# Patient Record
Sex: Male | Born: 1980 | Race: White | Hispanic: No | Marital: Single | Smoking: Former smoker
Health system: Southern US, Community
[De-identification: ages and names within clinical notes are randomized; demographics above are authoritative.]

---
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
## 2014-12-29 ENCOUNTER — Encounter: Payer: Self-pay | Admitting: Family Medicine

## 2014-12-29 ENCOUNTER — Ambulatory Visit (INDEPENDENT_AMBULATORY_CARE_PROVIDER_SITE_OTHER): Payer: Managed Care, Other (non HMO) | Admitting: Family Medicine

## 2014-12-29 VITALS — BP 121/71 | HR 65 | Ht 72.0 in | Wt 196.0 lb

## 2014-12-29 DIAGNOSIS — N508 Other specified disorders of male genital organs: Secondary | ICD-10-CM | POA: Diagnosis not present

## 2014-12-29 DIAGNOSIS — M7122 Synovial cyst of popliteal space [Baker], left knee: Secondary | ICD-10-CM | POA: Diagnosis not present

## 2014-12-29 DIAGNOSIS — N5082 Scrotal pain: Secondary | ICD-10-CM

## 2014-12-29 MED ORDER — MELOXICAM 15 MG PO TABS: 15.0000 mg | ORAL_TABLET | Freq: Every day | ORAL | Status: DC

## 2014-12-29 NOTE — Progress Notes (Signed)
CC: Christopher [name] is a 34 y.o. male is here for Establish Care   Subjective: HPI:  Pleasant 34 year old here to establish care  Complains of left knee pain that has been present for the past week. Localizing the medial surface of the knee. It is not radiating. It slightly improved with wearing a compression sleeve on the knee. It's also slightly improved with ibuprofen. He's noticed a bulge on the back of the knee that was noticed 1 week ago as well. He denies any recent or remote trauma. It seems to feel stiff and after periods of inactivity. He denies any mechanical symptoms or any other signs of swelling or redness on the knee.  He also complains of left testicle pain that has been present for the past 1-2 weeks. It's only present when taking hot showers. It's absent as soon as she gets out of the shower. Nothing else makes symptoms better or worse. It is mild in severity. He's never had this before. He denies any skin pain on the scrotum, penile discharge, dysuria or any other genitourinary complaints. Denies any recent or remote trauma to the testicles.   Review Of Systems Outlined In HPI  History reviewed. No pertinent past medical history.  Past Surgical History  Procedure Laterality Date  . Appendectomy  1989   Family History  Problem Relation Age of Onset  . Bone cancer      grandmother  . Diabetes      grandfather  . Stroke      grandfathers    Social History   Social History  . Marital Status: Single    Spouse Name: N/A  . Number of Children: N/A  . Years of Education: N/A   Occupational History  . Not on file.   Social History Main Topics  . Smoking status: Current Every Day Smoker  . Smokeless tobacco: Former Neurosurgeon    Quit date: 09/20/2010  . Alcohol Use: No  . Drug Use: Yes  . Sexual Activity:    Partners: Female   Other Topics Concern  . Not on file   Social History Narrative     Objective: BP 121/71 mmHg  Pulse 65  Ht 6' (1.829 m)  Wt 196  lb (88.905 kg)  BMI 26.58 kg/m2  General: Alert and Oriented, No Acute Distress HEENT: Pupils equal, round, reactive to light. Conjunctivae clear.  Moist mucous membranes Lungs: Clear to auscultation bilaterally, no wheezing/ronchi/rales.  Comfortable work of breathing. Good air movement. Cardiac: Regular rate and rhythm. Normal S1/S2.  No murmurs, rubs, nor gallops.   Genitourinary:patient declined Extremities: No peripheral edema.  Strong peripheral pulses. Left knee exam shows full-strength and range of motion. There is no swelling, redness, nor warmth overlying the knee.  No patellar crepitus. No patellar apprehension. No pain with palpation of the inferior patellar pole.  No pain or laxity with valgus nor varus stress. Anterior drawer is negative. Mild medial joint line tenderness to palpation. There is a approximate 1.5 cm soft fixed mass on the popliteal space which is non-pulsatile. Mental Status: No depression, anxiety, nor agitation. Skin: Warm and dry.  Assessment & Plan: Christopher [name] was seen today for establish care.  Diagnoses and all orders for this visit:  Baker cyst, left -     meloxicam (MOBIC) 15 MG tablet; Take 1 tablet (15 mg total) by mouth daily.  Scrotal pain -     meloxicam (MOBIC) 15 MG tablet; Take 1 tablet (15 mg total) by mouth daily.   Scrotal  pain: Most likely varicocele based only on history given inability to do exam today. Discussed wearing well fitting snug underwear to help with support of the scrotum in addition to starting meloxicam daily. If no better after 2 weeks return for examination or call for scheduling a scrotal ultrasound. Left knee pain and Baker's cyst: Start meloxicam and home rehabilitation exercises focusing on MCL rehabilitation. If no better in 2 weeks call and I will arrange an appointment with our sports medicine doctors for second opinion.  Return for Call if no better in 2 weeks, ultrasounds would be the next step.Marland Kitchen

## 2015-03-17 ENCOUNTER — Ambulatory Visit (INDEPENDENT_AMBULATORY_CARE_PROVIDER_SITE_OTHER): Payer: Managed Care, Other (non HMO) | Admitting: Family Medicine

## 2015-03-17 ENCOUNTER — Encounter: Payer: Self-pay | Admitting: Family Medicine

## 2015-03-17 VITALS — BP 114/77 | HR 62 | Wt 198.0 lb

## 2015-03-17 DIAGNOSIS — M545 Low back pain, unspecified: Secondary | ICD-10-CM

## 2015-03-17 DIAGNOSIS — H6091 Unspecified otitis externa, right ear: Secondary | ICD-10-CM | POA: Diagnosis not present

## 2015-03-17 MED ORDER — NEOMYCIN-POLYMYXIN-HC 1 % OT SOLN: OTIC | Status: AC

## 2015-03-17 MED ORDER — PREDNISONE 20 MG PO TABS: ORAL_TABLET | ORAL | Status: DC
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
## 2015-03-29 ENCOUNTER — Encounter: Payer: Self-pay | Admitting: Family Medicine

## 2015-03-29 ENCOUNTER — Encounter: Payer: Managed Care, Other (non HMO) | Admitting: Family Medicine

## 2015-03-29 ENCOUNTER — Ambulatory Visit (INDEPENDENT_AMBULATORY_CARE_PROVIDER_SITE_OTHER): Payer: Managed Care, Other (non HMO)

## 2015-03-29 ENCOUNTER — Ambulatory Visit (INDEPENDENT_AMBULATORY_CARE_PROVIDER_SITE_OTHER): Payer: Managed Care, Other (non HMO) | Admitting: Family Medicine

## 2015-03-29 ENCOUNTER — Ambulatory Visit (INDEPENDENT_AMBULATORY_CARE_PROVIDER_SITE_OTHER): Payer: Managed Care, Other (non HMO) | Admitting: Sports Medicine

## 2015-03-29 VITALS — BP 119/74 | HR 80 | Wt 205.0 lb

## 2015-03-29 DIAGNOSIS — R51 Headache: Secondary | ICD-10-CM

## 2015-03-29 DIAGNOSIS — R519 Headache, unspecified: Secondary | ICD-10-CM

## 2015-03-29 DIAGNOSIS — H538 Other visual disturbances: Secondary | ICD-10-CM | POA: Diagnosis not present

## 2015-03-29 DIAGNOSIS — M5416 Radiculopathy, lumbar region: Secondary | ICD-10-CM

## 2015-03-29 DIAGNOSIS — M51369 Other intervertebral disc degeneration, lumbar region without mention of lumbar back pain or lower extremity pain: Secondary | ICD-10-CM | POA: Insufficient documentation

## 2015-03-29 DIAGNOSIS — M545 Low back pain: Secondary | ICD-10-CM

## 2015-03-29 DIAGNOSIS — M5136 Other intervertebral disc degeneration, lumbar region: Secondary | ICD-10-CM | POA: Insufficient documentation
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
## 2015-03-29 NOTE — Progress Notes (Signed)
CC: Christopher [name] is a 34 y.o. male is here for Dizziness; Headache; and Blurred Vision   Subjective: HPI:  On Saturday this past weekend he had an episode of headache, 3 seconds blurred vision, and nausea which lasted the entire day. Headache was localized behind the eyes and pounding. Worse with loud noises and light. Symptoms came on after he drink multiple cups of coffee along with multiple candy bars and numerous bags of skittles. He's never had these symptoms before. He is in his regular state of health today. He denies racing heartbeat, irregular heartbeat, chest pain. He denies any confusion or disorientation. He denies any motor or sensory disturbances other than that described above.   Review Of Systems Outlined In HPI  No past medical history on file.  Past Surgical History  Procedure Laterality Date  . Appendectomy  1989   Family History  Problem Relation Age of Onset  . Bone cancer      grandmother  . Diabetes      grandfather  . Stroke      grandfathers    Social History   Social History  . Marital Status: Single    Spouse Name: N/A  . Number of Children: N/A  . Years of Education: N/A   Occupational History  . Not on file.   Social History Main Topics  . Smoking status: Current Every Day Smoker  . Smokeless tobacco: Former NeurosurgeonUser    Quit date: 09/20/2010  . Alcohol Use: No  . Drug Use: Yes  . Sexual Activity:    Partners: Female   Other Topics Concern  . Not on file   Social History Narrative     Objective: BP 119/74 mmHg  Pulse 80  Wt 205 lb (92.987 kg)  General: Alert and Oriented, No Acute Distress HEENT: Pupils equal, round, reactive to light. Conjunctivae clear.  Moist mucous membranes pharynx unremarkable Cranial nerve II through XII grossly intact Lungs: Clear to auscultation bilaterally, no wheezing/ronchi/rales.  Comfortable work of breathing. Good air movement. Cardiac: Regular rate and rhythm. Normal S1/S2.  No murmurs, rubs,  nor gallops.   Extremities: No peripheral edema.  Strong peripheral pulses.  Mental Status: No depression, anxiety, nor agitation. Skin: Warm and dry.  Assessment & Plan: [name] was seen today for dizziness, headache and blurred vision.  Diagnoses and all orders for this visit:  Blurred vision -     CBC -     COMPLETE METABOLIC PANEL WITH GFR  Acute nonintractable headache, unspecified headache type -     CBC -     COMPLETE METABOLIC PANEL WITH GFR   Transient blurred vision along with headache. Suspicion is that he had a small migraine triggered by all the sugar he was eating. Will rule out more serious conditions with CBC and metabolic panel above. Signs and symptoms requring emergent/urgent reevaluation were discussed with the patient.   Return if symptoms worsen or fail to improve.

## 2015-03-29 NOTE — Progress Notes (Signed)
   Subjective:    I'm seeing this patient as a consultation for:  Dr. Ivan AnchorsHommel  CC: low back pain  HPI: This is a pleasant 34 -year-old male, for the past several years he's had increasing pain that he localizes in the midline of the low back, worse with sitting, flexion, Valsalva, with radiation down the left leg, but not to the foot. No bowel or bladder dysfunction, saddle numbness.He finished a course of prednisone that provided some relief, he has also been doing meloxicam which has also provided greater relief. Unfortunately symptoms are persistent, has not yet done physical therapy or had advanced imaging.  Past medical history, Surgical history, Family history not pertinant except as noted below, Social history, Allergies, and medications have been entered into the medical record, reviewed, and no changes needed.   Review of Systems: No headache, visual changes, nausea, vomiting, diarrhea, constipation, dizziness, abdominal pain, skin rash, fevers, chills, night sweats, weight loss, swollen lymph nodes, body aches, joint swelling, muscle aches, chest pain, shortness of breath, mood changes, visual or auditory hallucinations.   Objective:   General: Well Developed, well nourished, and in no acute distress.  Neuro/Psych: Alert and oriented x3, extra-ocular muscles intact, able to move all 4 extremities, sensation grossly intact. Skin: Warm and dry, no rashes noted.  Respiratory: Not using accessory muscles, speaking in full sentences, trachea midline.  Cardiovascular: Pulses palpable, no extremity edema. Abdomen: Does not appear distended. Back Exam:  Inspection: Unremarkable  Motion: Flexion 45 deg, Extension 45 deg, Side Bending to 45 deg bilaterally,  Rotation to 45 deg bilaterally  SLR laying: Negative  XSLR laying: Negative  Palpable tenderness: None. FABER: negative. Sensory change: Gross sensation intact to all lumbar and sacral dermatomes.  Reflexes: 2+ at both patellar  tendons, 2+ at achilles tendons, Babinski's downgoing.  Strength at foot  Plantar-flexion: 5/5 Dorsi-flexion: 5/5 Eversion: 5/5 Inversion: 5/5  Leg strength  Quad: 5/5 Hamstring: 5/5 Hip flexor: 5/5 Hip abductors: 5/5  Gait unremarkable.  Impression and Recommendations:   This case required medical decision making of moderate complexity.

## 2015-03-30 LAB — COMPLETE METABOLIC PANEL WITH GFR
ALK PHOS: 40 U/L (ref 40–115)
ALT: 20 U/L (ref 9–46)
AST: 20 U/L (ref 10–40)
Albumin: 4.1 g/dL (ref 3.6–5.1)
BILIRUBIN TOTAL: 0.4 mg/dL (ref 0.2–1.2)
BUN: 17 mg/dL (ref 7–25)
CALCIUM: 9.2 mg/dL (ref 8.6–10.3)
CO2: 31 mmol/L (ref 20–31)
CREATININE: 1.07 mg/dL (ref 0.60–1.35)
Chloride: 100 mmol/L (ref 98–110)
GFR, Est Non African American: 89 mL/min (ref >=60)
Glucose, Bld: 87 mg/dL (ref 65–99)
Potassium: 4.1 mmol/L (ref 3.5–5.3)
Sodium: 143 mmol/L (ref 135–146)
TOTAL PROTEIN: 6.5 g/dL (ref 6.1–8.1)

## 2015-03-30 LAB — CBC
HEMATOCRIT: 45.5 % (ref 39.0–52.0)
Hemoglobin: 15.6 g/dL (ref 13.0–17.0)
MCH: 30.5 pg (ref 26.0–34.0)
MCHC: 34.3 g/dL (ref 30.0–36.0)
MCV: 89 fL (ref 78.0–100.0)
MPV: 9.5 fL (ref 8.6–12.4)
Platelets: 182 10*3/uL (ref 150–400)
RBC: 5.11 MIL/uL (ref 4.22–5.81)
RDW: 13.6 % (ref 11.5–15.5)
WBC: 8.1 10*3/uL (ref 4.0–10.5)

## 2015-03-31 ENCOUNTER — Other Ambulatory Visit: Payer: Self-pay | Admitting: *Deleted

## 2015-03-31 ENCOUNTER — Telehealth: Payer: Self-pay | Admitting: *Deleted

## 2015-03-31 MED ORDER — MELOXICAM 15 MG PO TABS: 15.0000 mg | ORAL_TABLET | Freq: Every day | ORAL | Status: DC

## 2015-03-31 NOTE — Telephone Encounter (Signed)
Spoke with patient about lab and xray results.  He mentioned getting a refill on the meloxicam to help with his back pain Sent to his pharmacy

## 2015-04-14 ENCOUNTER — Ambulatory Visit: Payer: Managed Care, Other (non HMO) | Admitting: Rehabilitative and Restorative Service Providers"

## 2015-04-21 ENCOUNTER — Encounter: Payer: Self-pay | Admitting: Sports Medicine

## 2015-05-03 ENCOUNTER — Ambulatory Visit: Payer: Managed Care, Other (non HMO) | Admitting: Sports Medicine

## 2015-05-05 ENCOUNTER — Encounter: Payer: Self-pay | Admitting: Physical Therapy

## 2015-05-05 ENCOUNTER — Ambulatory Visit (INDEPENDENT_AMBULATORY_CARE_PROVIDER_SITE_OTHER): Payer: Managed Care, Other (non HMO) | Admitting: Physical Therapy

## 2015-05-05 DIAGNOSIS — M6281 Muscle weakness (generalized): Secondary | ICD-10-CM | POA: Diagnosis not present

## 2015-05-05 DIAGNOSIS — M5442 Lumbago with sciatica, left side: Secondary | ICD-10-CM | POA: Diagnosis not present

## 2015-05-05 DIAGNOSIS — M2560 Stiffness of unspecified joint, not elsewhere classified: Secondary | ICD-10-CM

## 2015-05-05 DIAGNOSIS — R29898 Other symptoms and signs involving the musculoskeletal system: Secondary | ICD-10-CM

## 2015-05-05 DIAGNOSIS — M2569 Stiffness of other specified joint, not elsewhere classified: Secondary | ICD-10-CM
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
## 2015-05-05 NOTE — Patient Instructions (Signed)
Pelvic Press   [place] [phone]       Place hands under belly between navel and pubic bone, palms up. Feel pressure on hands. Increase pressure on hands by pressing pelvis down. This is NOT a pelvic tilt. Hold _5__ seconds. Relax. Repeat _5__ times.  Leg Lift: One-Leg    Press pelvis down. Keep knee straight; lengthen and lift one leg (from waist). Do not twist body. Keep other leg down. Hold _1__ seconds. Relax. Repeat 10 time. Repeat with other leg. Build up 2 sets of 10.   Outer Hip Stretch: Reclined IT Band Stretch (Strap)    Strap around opposite foot, pull across only as far as possible with shoulders on mat. Hold for _10___ breaths. Repeat __2__ times each leg.  Copyright  VHI. All rights reserved.

## 2015-05-07 ENCOUNTER — Ambulatory Visit (INDEPENDENT_AMBULATORY_CARE_PROVIDER_SITE_OTHER): Payer: Managed Care, Other (non HMO) | Admitting: Physical Therapy

## 2015-05-07 DIAGNOSIS — M2560 Stiffness of unspecified joint, not elsewhere classified: Secondary | ICD-10-CM | POA: Diagnosis not present

## 2015-05-07 DIAGNOSIS — M6281 Muscle weakness (generalized): Secondary | ICD-10-CM | POA: Diagnosis not present

## 2015-05-07 DIAGNOSIS — R29898 Other symptoms and signs involving the musculoskeletal system: Secondary | ICD-10-CM

## 2015-05-07 DIAGNOSIS — M2569 Stiffness of other specified joint, not elsewhere classified: Secondary | ICD-10-CM

## 2015-05-07 DIAGNOSIS — M5442 Lumbago with sciatica, left side: Secondary | ICD-10-CM | POA: Diagnosis not present

## 2015-05-07 NOTE — Patient Instructions (Signed)
  KNEE: Flexion - Prone   Hold pelvic press. Bend knee, then return the foot down. Repeat on opposite leg. Do not raise hips. _10__ reps per set. When this is mastered, pull both heels up at same time, x 10 reps.  Once a day  HIP: Extension / KNEE: Flexion - Prone    Hold pelvic press. Bend knee, squeeze glutes. Raise leg up  10___ reps per set, _1__ sets per day, _1__ time a day.   Abdominal Bracing With Pelvic Floor (Hook-Lying)    With neutral spine, tighten pelvic floor and abdominals, hold 5-10 sec. Repeat _10__ times. Do _1__ times a day.  Scapular Retraction: Abduction (Prone)    Lie with upper arms straight out from sides, elbows bent to 90. Pinch shoulder blades together and raise arms a few inches from floor. Slightly lift forehead off towel.  Repeat __10__ times per set. Do _1-2___ sets per session. Do _1___ sessions per day.  [facility] [address] [place], [place] [address]  [phone] (office) [phone] (fax)

## 2015-05-07 NOTE — Therapy (Signed)
[facility] [place] [address] [place], [place], [address] Phone: [phone]   Fax:  [phone]  Physical Therapy Treatment  Patient Details  Name: Christopher [name] MRN: [id number] Date of Birth: Sep 07, 1980 Referring Provider: Dr. Briant Sites  Encounter Date: 05/07/2015      PT End of Session - 05/07/15 1107    Visit Number 2   Number of Visits 8   Date for PT Re-Evaluation 06/02/15   PT Start Time 1105   PT Stop Time 1150   PT Time Calculation (min) 45 min      No past medical history on file.  Past Surgical History  Procedure Laterality Date  . Appendectomy  1989    There were no vitals filed for this visit.  Visit Diagnosis:  Left-sided low back pain with left-sided sciatica  Weakness of back  Back stiffness      Subjective Assessment - 05/07/15 1108    Subjective Pt reports he feels a little better since last visit. He believes the exercises are helping.  Pt reports he only has pain with bending, lifting, and long drives (to [place]).     Currently in Pain? No/denies            [facility] PT Assessment - 05/07/15 0001    Assessment   Medical Diagnosis Lt lumbar radiculopathy    Referring Provider Dr. Briant Sites   Onset Date/Surgical Date 05/04/10   Hand Dominance Right   Next MD Visit not scheduled yet         [facility] Adult PT Treatment/Exercise - 05/07/15 0001    Bed Mobility   Bed Mobility --  educated on log roll supine to/from sit. Pt returned demo 2x with VC.    Lumbar Exercises: Stretches   Passive Hamstring Stretch 30 seconds;2 reps  2 sets   ITB Stretch 1 rep;60 seconds  with strap, supine   Piriformis Stretch 2 reps;30 seconds   Lumbar Exercises: Aerobic   Stationary Bike NuStep L4: 6 min    Lumbar Exercises: Standing   Other Standing Lumbar Exercises Doorway stretch mid height x 30 sec x 2, high height x 30 sec x 2 reps, shoulder ext stretch (hands clasped behind back) 30 sec  x 2 reps    Lumbar Exercises: Supine   Ab Set 10 reps;5 seconds   Lumbar Exercises: Prone   Other Prone Lumbar Exercises 10 reps pelvic press 5 sec hold, 10 reps press with hip ext.( knee straight), 10 reps each leg hip ext, knee bent.   noted Lt multifidi firing before Rt   Other Prone Lumbar Exercises scap retraction with goal post position arms x 10 reps (tactile cues and demo for improved form)          PT Education - 05/07/15 1149    Education provided Yes   Education Details HEP    Person(s) Educated Patient   Methods Handout;Explanation;Demonstration   Comprehension Verbalized understanding;Returned demonstration            PT Long Term Goals - 05/05/15 1031    PT LONG TERM GOAL #1   Title I with advance HEP ( 06/02/15)    Time 4   Period Weeks   Status New   PT LONG TERM GOAL #2   Title perform forward flexion without pain ( 06/02/15)    Time 4   Period Weeks   Status New   PT LONG TERM GOAL #3   Title demo strong and = contraction of the  lumbar multifidi ( 06/02/15)    Time 4   Period Weeks   Status New   PT LONG TERM GOAL #4   Title verbalize understanding of safe body mechanics while performing yard work ( 06/02/15)    Time 4   Period Weeks   Status New   PT LONG TERM GOAL #5   Title improve FOTO =/< 27% limited ( 06/02/15)    Time 4   Period Weeks   Status New               Plan - 05/07/15 1127    Clinical Impression Statement Pt tolerated all exercises well, reporting decreased stiffness with new stretches.  Progressing towards goals.    Pt will benefit from skilled therapeutic intervention in order to improve on the following deficits Increased muscle spasms;Pain;Hypomobility;Decreased strength   Rehab Potential Good   PT Frequency 2x / week   PT Duration 4 weeks   PT Treatment/Interventions Ultrasound;Traction;Neuromuscular re-education;Patient/family education;Dry needling;Cryotherapy;Electrical Stimulation;Moist Heat;Therapeutic  exercise;Manual techniques   PT Next Visit Plan progress pelvic press series and stretches, manual work to lumbar mobs vs STW   Consulted and Agree with Plan of Care Patient        Problem List Patient Active Problem List   Diagnosis Date Noted  . Left lumbar radiculopathy 03/29/2015    Mayer Camel, PTA 05/07/2015 12:17 PM  [facility] [place] [address] [place], [place], [address] Phone: [phone]   Fax:  [phone]  Name: Christopher [name] MRN: [id number] Date of Birth: 1980/11/08

## 2015-05-11 ENCOUNTER — Ambulatory Visit (INDEPENDENT_AMBULATORY_CARE_PROVIDER_SITE_OTHER): Payer: Managed Care, Other (non HMO) | Admitting: Physical Therapy

## 2015-05-11 DIAGNOSIS — M2569 Stiffness of other specified joint, not elsewhere classified: Secondary | ICD-10-CM

## 2015-05-11 DIAGNOSIS — M2560 Stiffness of unspecified joint, not elsewhere classified: Secondary | ICD-10-CM

## 2015-05-11 DIAGNOSIS — M5442 Lumbago with sciatica, left side: Secondary | ICD-10-CM | POA: Diagnosis not present

## 2015-05-11 DIAGNOSIS — M6281 Muscle weakness (generalized): Secondary | ICD-10-CM

## 2015-05-11 DIAGNOSIS — R29898 Other symptoms and signs involving the musculoskeletal system: Secondary | ICD-10-CM

## 2015-05-11 NOTE — Patient Instructions (Signed)
  Abdominal Bracing With Pelvic Floor (Hook-Lying)   With neutral spine, tighten pelvic floor and abdominals. Hold 10 seconds. Repeat __10_ times. Do _1__ times a day.   Knee to Chest: Transverse Plane Stability   Bring one knee up, then return. Be sure pelvis does not roll side to side. Keep pelvis still. Lift knee __10_ times each leg. Restabilize pelvis. Repeat with other leg. Do _1-2__ sets, _1__ times per day.   Hip External Rotation With Pillow: Transverse Plane Stability   One knee bent, one leg straight, on pillow. Slowly roll bent knee out. Be sure pelvis does not rotate. Do _10__ times. Restabilize pelvis. Repeat with other leg. Do _1-2__ sets, _1__ times per day.  Heel Slide: 4-10 Inches - Transverse Plane Stability   Slide heel 4 inches down. Be sure pelvis does not rotate. Do _10__ times. Restabilize pelvis. Repeat with other leg. Do __1_ sets, _1__ times per day.   [facility] [address] [place], [place] [address]  [phone] (office) [phone] (fax)   

## 2015-05-11 NOTE — Therapy (Addendum)
[facility], [place], [address] Phone: [phone]   Fax:  [phone]  Physical Therapy Treatment  Patient Details  Name: Christopher [name] MRN: [id number] Date of Birth: January 02, 1981 Referring Provider: Dr. Helane Rima  Encounter Date: 05/11/2015      PT End of Session - 05/11/15 1104    Visit Number 3   Number of Visits 8   Date for PT Re-Evaluation 06/02/15   PT Start Time 1104   PT Stop Time 1144   PT Time Calculation (min) 40 min      No past medical history on file.  Past Surgical History  Procedure Laterality Date  . Appendectomy  1989    There were no vitals filed for this visit.  Visit Diagnosis:  Left-sided low back pain with left-sided sciatica  Weakness of back  Back stiffness      Subjective Assessment - 05/11/15 1105    Subjective Pt reports things are improving with his low back. He has noticed reduced pain with bending over.   Performing HEP right before bed after work.    Currently in Pain? Yes   Pain Score 1    Pain Location Back   Pain Orientation Left   Pain Descriptors / Indicators Tightness;Dull   Pain Radiating Towards down to posterior Lt knee   Aggravating Factors  prolonged sitting   Pain Relieving Factors standing, heat             [facility] PT Assessment - 05/11/15 0001    Assessment   Medical Diagnosis Lt lumbar radiculopathy    Referring Provider Dr. Helane Rima   Onset Date/Surgical Date 05/04/10   Hand Dominance Right   Next MD Visit not scheduled yet           [facility] Adult PT Treatment/Exercise - 05/11/15 0001    Lumbar Exercises: Stretches   Passive Hamstring Stretch 30 seconds;2 reps  2 sets   ITB Stretch 2 reps;30 seconds  supine with strap   ITB Stretch Limitations tactile cues for improved form    Piriformis Stretch 3 reps;30 seconds  (1 rep on Rt) 1 rep sitting on Lt.    Lumbar Exercises: Supine   Ab Set 10 reps;5 seconds   AB Set Limitations (then 5 reps with trans ab with multifidus engaged.    Clam 10 reps  each leg, with ab set   Heel Slides 10 reps  each leg with ab set   Bent Knee Raise 10 reps  each leg, with ab set   Lumbar Exercises: Prone   Opposite Arm/Leg Raise 10 reps;Right arm/Left leg;Left arm/Right leg   Other Prone Lumbar Exercises Pelvic press x5 sec hold x 5 reps; repeated with hip ext (knee bent x 5 reps each side.     Manual Therapy   Manual Therapy Soft tissue mobilization;Passive ROM   Soft tissue mobilization Deep pressure to hip rotators with passive ROM.    Passive ROM Lt hip ER/ IR            PT Education - 05/11/15 1129    Education provided Yes   Education Details HEP - transverse abdominal series   Person(s) Educated Patient   Methods Handout;Explanation;Demonstration   Comprehension Verbalized understanding;Returned demonstration             PT Long Term Goals - 05/05/15 1031    PT LONG TERM GOAL #1   Title I with advance HEP ( 06/02/15)    Time 4  Period Weeks   Status New   PT LONG TERM GOAL #2   Title perform forward flexion without pain ( 06/02/15)    Time 4   Period Weeks   Status New   PT LONG TERM GOAL #3   Title demo strong and = contraction of the lumbar multifidi ( 06/02/15)    Time 4   Period Weeks   Status New   PT LONG TERM GOAL #4   Title verbalize understanding of safe body mechanics while performing yard work ( 06/02/15)    Time 4   Period Weeks   Status New   PT LONG TERM GOAL #5   Title improve FOTO =/< 27% limited ( 06/02/15)    Time 4   Period Weeks   Status New               Plan - 05/11/15 1122    Clinical Impression Statement Notable tightness/tenderness in Lt hip external rotators with stretches and manual therapy. Pt tolerated all exercises well without increase in Lt back pain or radicular symptoms. Improved engagement of multifidi and transverse abdominals. Progressing well towards stated goals.    Pt will  benefit from skilled therapeutic intervention in order to improve on the following deficits Increased muscle spasms;Pain;Hypomobility;Decreased strength   Rehab Potential Good   PT Frequency 2x / week   PT Duration 4 weeks   PT Treatment/Interventions Ultrasound;Traction;Neuromuscular re-education;Patient/family education;Dry needling;Cryotherapy;Electrical Stimulation;Moist Heat;Therapeutic exercise;Manual techniques   PT Next Visit Plan progress pelvic press series and stretches, manual work to lumbar mobs vs STW if needed.         Problem List Patient Active Problem List   Diagnosis Date Noted  . Left lumbar radiculopathy 03/29/2015    Kerin Perna, PTA 05/11/2015 11:53 AM  [place], [place], [address] Phone: [phone]   Fax:  [phone]  Name: Christopher [name] MRN: [id number] Date of Birth: [birth year]    PHYSICAL THERAPY DISCHARGE SUMMARY  Visits from Start of Care: 3  Current functional level related to goals / functional outcomes: unknown   Remaining deficits: unknown   Education / Equipment: HEP  Plan:                                                    Patient goals were not met. Patient is being discharged due to not returning since the last visit.  ?????    Jeral Pinch, PT 06/10/2015 8:47 AM

## 2015-05-13 ENCOUNTER — Encounter: Payer: Managed Care, Other (non HMO) | Admitting: Physical Therapy

## 2015-05-31 ENCOUNTER — Other Ambulatory Visit: Payer: Self-pay | Admitting: Sports Medicine

## 2015-06-02 ENCOUNTER — Ambulatory Visit (INDEPENDENT_AMBULATORY_CARE_PROVIDER_SITE_OTHER): Payer: Managed Care, Other (non HMO) | Admitting: Family Medicine

## 2015-06-02 ENCOUNTER — Encounter: Payer: Self-pay | Admitting: Family Medicine

## 2015-06-02 VITALS — BP 112/69 | HR 78 | Wt 204.0 lb

## 2015-06-02 DIAGNOSIS — M5416 Radiculopathy, lumbar region: Secondary | ICD-10-CM | POA: Diagnosis not present

## 2015-06-02 DIAGNOSIS — S7000XA Contusion of unspecified hip, initial encounter: Secondary | ICD-10-CM | POA: Insufficient documentation

## 2015-06-02 DIAGNOSIS — S7002XA Contusion of left hip, initial encounter: Secondary | ICD-10-CM

## 2015-06-02 MED ORDER — PREDNISONE 20 MG PO TABS: ORAL_TABLET | ORAL | Status: AC

## 2015-06-02 MED ORDER — MELOXICAM 15 MG PO TABS: 15.0000 mg | ORAL_TABLET | Freq: Every day | ORAL | Status: DC
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
## 2017-05-23 ENCOUNTER — Encounter: Payer: Self-pay | Admitting: Osteopathic Medicine

## 2017-05-23 ENCOUNTER — Ambulatory Visit (INDEPENDENT_AMBULATORY_CARE_PROVIDER_SITE_OTHER): Payer: BLUE CROSS/BLUE SHIELD | Admitting: Osteopathic Medicine

## 2017-05-23 VITALS — BP 123/82 | HR 72 | Temp 98.1°F | Ht 72.0 in | Wt 238.0 lb

## 2017-05-23 DIAGNOSIS — M67972 Unspecified disorder of synovium and tendon, left ankle and foot: Secondary | ICD-10-CM | POA: Diagnosis not present

## 2017-05-23 DIAGNOSIS — M25572 Pain in left ankle and joints of left foot: Secondary | ICD-10-CM | POA: Insufficient documentation

## 2017-05-23 NOTE — Progress Notes (Signed)
HPI: Christopher [name] is a 37 y.o. male who  has no past medical history on file.  he presents to [facility] today, 05/23/17,  for chief complaint of: Pain in L ankle and lower back   States he's already had Xrays done and doesn't want to get repeats done. Seen by Dr T and Dr Ivan Anchors for this. Was given Meloxicam but didn't take this.   Currently, left heel Achilles tendon area is bothering him. No major injury that he can recall to this area recently. No swelling, new gait abnormality. He is wearing insoles, not custom fitted     Past medical, surgical, social and family history reviewed:  Patient Active Problem List   Diagnosis Date Noted  . Hematoma of hip 06/02/2015  . Left lumbar radiculopathy 03/29/2015    Past Surgical History:  Procedure Laterality Date  . APPENDECTOMY  1989    Social History   Tobacco Use  . Smoking status: Former Smoker    Last attempt to quit: 09/20/2010    Years since quitting: 6.6  . Smokeless tobacco: Former Neurosurgeon    Quit date: 09/20/2010  Substance Use Topics  . Alcohol use: No    Alcohol/week: 0.0 oz    Family History  Problem Relation Age of Onset  . Bone cancer Unknown        grandmother  . Diabetes Unknown        grandfather  . Stroke Unknown        grandfathers     Current medication list and allergy/intolerance information reviewed:    Current Outpatient Medications  Medication Sig Dispense Refill  . meloxicam (MOBIC) 15 MG tablet Take 1 tablet (15 mg total) by mouth daily. (Patient not taking: Reported on 05/23/2017) 30 tablet 1   No current facility-administered medications for this visit.     No Known Allergies    Review of Systems:  Constitutional:  No  fever, no chills, No recent illness  Cardiac: No  chest pain,  Respiratory:  No  shortness of breath  Gastrointestinal: No  abdominal pain,  Musculoskeletal: +new myalgia/arthralgia  Skin: No  Rash, No other  wounds/concerning lesions    Exam:  BP 123/82   Pulse 72   Temp 98.1 F (36.7 C) (Oral)   Ht 6' (1.829 m)   Wt 238 lb 0.6 oz (108 kg)   BMI 32.28 kg/m   Constitutional: VS see above. General Appearance: alert, well-developed, well-nourished, NAD  Eyes: Normal lids and conjunctive, non-icteric sclera  Ears, Nose, Mouth, Throat: MMM, Normal external inspection ears/nares/mouth/lips/gums.   Neck: No masses, trachea midline.   Respiratory: Normal respiratory effort.   Cardiovascular: No lower extremity edema.  Musculoskeletal: Gait normal. No clubbing/cyanosis of digits.   Some tenderness in the body of the left Achilles tendon, no obvious swelling/effusion. No skin changes. Active and passive range of motion normal to flexion, extension, even urgent, inversion. Patient reports some pain in the Achilles area with plantar flexion to about 120, interestingly not with resisted plantar flexion to about 90  Neurological: Normal balance/coordination. No tremor.    Skin: warm, dry, intact. No rash/ulcer. No concerning nevi or subq nodules on limited exam.    Psychiatric: Normal judgment/insight. Normal mood and affect. Oriented x3.      ASSESSMENT/PLAN:   Achilles tendon disorder, left - heel lifts provided, home exercises provided. Follow-up with sports medicine.    Patient Instructions  If heel/ankle is not better or if it gets worse,  I would recommend follow-up with one of Dr. Karie Schwalbe for further evaluation in 2-4 weeks - likely will benefit from orthotics, be sure you schedule a visit for this specifically. Home exercises and anti-inflammatories with Meloxicam. Try heel lifts in shoes as well.      Visit summary with medication list and pertinent instructions was printed for patient to review. All questions at time of visit were answered - patient instructed to contact office with any additional concerns. ER/RTC precautions were reviewed with the patient.   Follow-up plan:  Return for recheck with sports medicine as directed .   Please note: voice recognition software was used to produce this document, and typos may escape review. Please contact Dr. Lyn HollingsheadAlexander for any needed clarifications.

## 2017-05-23 NOTE — Patient Instructions (Signed)
If heel/ankle is not better or if it gets worse, I would recommend follow-up with one of Dr. Karie Schwalbe for further evaluation in 2-4 weeks - likely will benefit from orthotics, be sure you schedule a visit for this specifically. Home exercises and anti-inflammatories with Meloxicam. Try heel lifts in shoes as well.

## 2017-06-18 ENCOUNTER — Encounter: Payer: Self-pay | Admitting: Sports Medicine

## 2017-06-18 ENCOUNTER — Ambulatory Visit (INDEPENDENT_AMBULATORY_CARE_PROVIDER_SITE_OTHER): Payer: BLUE CROSS/BLUE SHIELD

## 2017-06-18 ENCOUNTER — Ambulatory Visit (INDEPENDENT_AMBULATORY_CARE_PROVIDER_SITE_OTHER): Payer: BLUE CROSS/BLUE SHIELD | Admitting: Sports Medicine

## 2017-06-18 DIAGNOSIS — M25572 Pain in left ankle and joints of left foot: Secondary | ICD-10-CM | POA: Diagnosis not present

## 2017-06-18 DIAGNOSIS — G8929 Other chronic pain: Secondary | ICD-10-CM | POA: Diagnosis not present

## 2017-06-18 MED ORDER — MELOXICAM 15 MG PO TABS: ORAL_TABLET | ORAL | 3 refills | Status: DC

## 2017-06-18 NOTE — Progress Notes (Signed)
    Patient was fitted for a : standard, cushioned, semi-rigid orthotic. The orthotic was heated and afterward the patient stood on the orthotic blank positioned on the orthotic stand. The patient was positioned in subtalar neutral position and 10 degrees of ankle dorsiflexion in a weight bearing stance. After completion of molding, a stable base was applied to the orthotic blank. The blank was ground to a stable position for weight bearing. Size: 13 Base: White EVA Additional Posting and Padding: None The patient ambulated these, and they were very comfortable.  I spent 40 minutes with this patient, greater than 50% was face-to-face time counseling regarding the below diagnosis.  ___________________________________________ Thomas J. Thekkekandam, M.D., ABFM., CAQSM. Primary Care and Sports Medicine [facility]  Adjunct Instructor of Family Medicine  [employer]   

## 2017-06-18 NOTE — Assessment & Plan Note (Signed)
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 Restarting meloxicam, x-rays, return in 1 month, MRI if no better.

## 2017-07-05 ENCOUNTER — Encounter: Payer: BLUE CROSS/BLUE SHIELD | Admitting: Sports Medicine

## 2017-07-19 ENCOUNTER — Encounter: Payer: Self-pay | Admitting: Sports Medicine

## 2017-07-19 ENCOUNTER — Ambulatory Visit (INDEPENDENT_AMBULATORY_CARE_PROVIDER_SITE_OTHER): Payer: BLUE CROSS/BLUE SHIELD | Admitting: Sports Medicine

## 2017-07-19 ENCOUNTER — Encounter: Payer: Self-pay | Admitting: Osteopathic Medicine

## 2017-07-19 DIAGNOSIS — M25572 Pain in left ankle and joints of left foot: Secondary | ICD-10-CM

## 2017-07-19 DIAGNOSIS — G8929 Other chronic pain: Secondary | ICD-10-CM

## 2017-07-19 NOTE — Assessment & Plan Note (Signed)
Resolved with custom orthotics and meloxicam, no change in treatment needed. He can return for another set of custom orthotics as needed. 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 He may return to see me on an as-needed basis.

## 2017-07-19 NOTE — Progress Notes (Signed)
Subjective:    CC: Follow-up  HPI: This is a pleasant 37 year old male, we have been treating for an ankle impingement posterior, we made custom orthotics, he did some rehab exercises, meloxicam, returns today feeling for the most part pain-free.  I reviewed the past medical history, family history, social history, surgical history, and allergies today and no changes were needed.  Please see the problem list section below in epic for further details.  Past Medical History: No past medical history on file. Past Surgical History: Past Surgical History:  Procedure Laterality Date  . APPENDECTOMY  1989   Social History: Social History   Socioeconomic History  . Marital status: Single    Spouse name: Not on file  . Number of children: Not on file  . Years of education: Not on file  . Highest education level: Not on file  Occupational History  . Not on file  Social Needs  . Financial resource strain: Not on file  . Food insecurity:    Worry: Not on file    Inability: Not on file  . Transportation needs:    Medical: Not on file    Non-medical: Not on file  Tobacco Use  . Smoking status: Former Smoker    Last attempt to quit: 09/20/2010    Years since quitting: 6.8  . Smokeless tobacco: Former NeurosurgeonUser    Quit date: 09/20/2010  Substance and Sexual Activity  . Alcohol use: No    Alcohol/week: 0.0 oz  . Drug use: Yes  . Sexual activity: Yes    Partners: Female  Lifestyle  . Physical activity:    Days per week: Not on file    Minutes per session: Not on file  . Stress: Not on file  Relationships  . Social connections:    Talks on phone: Not on file    Gets together: Not on file    Attends religious service: Not on file    Active member of club or organization: Not on file    Attends meetings of clubs or organizations: Not on file    Relationship status: Not on file  Other Topics Concern  . Not on file  Social History Narrative  . Not on file   Family  History: Family History  Problem Relation Age of Onset  . Bone cancer Unknown        grandmother  . Diabetes Unknown        grandfather  . Stroke Unknown        grandfathers   Allergies: No Known Allergies Medications: See med rec.  Review of Systems: No fevers, chills, night sweats, weight loss, chest pain, or shortness of breath.   Objective:    General: Well Developed, well nourished, and in no acute distress.  Neuro: Alert and oriented x3, extra-ocular muscles intact, sensation grossly intact.  HEENT: Normocephalic, atraumatic, pupils equal round reactive to light, neck supple, no masses, no lymphadenopathy, thyroid nonpalpable.  Skin: Warm and dry, no rashes. Cardiac: Regular rate and rhythm, no murmurs rubs or gallops, no lower extremity edema.  Respiratory: Clear to auscultation bilaterally. Not using accessory muscles, speaking in full sentences.  Impression and Recommendations:    Left ankle pain Resolved with custom orthotics and meloxicam, no change in treatment needed. He can return for another set of custom orthotics as needed. 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 He may return to see me on an as-needed  basis. ___________________________________________ Ihor Austin. Benjamin Stain, M.D., ABFM., CAQSM. Primary Care and Sports Medicine [facility]  Adjunct Instructor of Family Medicine  [employer]

## 2017-11-02 ENCOUNTER — Other Ambulatory Visit: Payer: Self-pay | Admitting: Sports Medicine

## 2017-11-02 DIAGNOSIS — G8929 Other chronic pain: Secondary | ICD-10-CM

## 2017-11-02 DIAGNOSIS — M25572 Pain in left ankle and joints of left foot: Principal | ICD-10-CM

## 2018-01-08 ENCOUNTER — Telehealth: Payer: Self-pay

## 2018-01-08 NOTE — Telephone Encounter (Signed)
Pt inquiring about:  1. Having a blood typing test done, just because he wants to know his blood type  2. Having blood drawn for allergy testing, he is unsure of "possible undocumented allergies" and wants to see what his allergies are  Note to PCP to see if these things can be ordered..... Thanks!

## 2018-01-11 NOTE — Telephone Encounter (Signed)
I typically do not order blood testing without an office visit to discuss the patient's concerns in detail.  Especially if he is having symptoms of allergies, this may be something that we can help treat, also he is not being specific about respiratory versus GI/food allergies.  Please have him schedule an appointment if he would like any blood testing done

## 2018-01-11 NOTE — Telephone Encounter (Signed)
Left pt msg on ID'd VM stating that he needs to call back and schedule appt with PCP to further discuss this/get this ordered

## 2018-01-14 ENCOUNTER — Encounter: Payer: Self-pay | Admitting: Sports Medicine

## 2018-01-14 ENCOUNTER — Ambulatory Visit (INDEPENDENT_AMBULATORY_CARE_PROVIDER_SITE_OTHER): Payer: BLUE CROSS/BLUE SHIELD

## 2018-01-14 ENCOUNTER — Ambulatory Visit (INDEPENDENT_AMBULATORY_CARE_PROVIDER_SITE_OTHER): Payer: BLUE CROSS/BLUE SHIELD | Admitting: Sports Medicine

## 2018-01-14 DIAGNOSIS — M5416 Radiculopathy, lumbar region: Secondary | ICD-10-CM

## 2018-01-14 DIAGNOSIS — M2578 Osteophyte, vertebrae: Secondary | ICD-10-CM

## 2018-01-14 DIAGNOSIS — M545 Low back pain: Secondary | ICD-10-CM | POA: Diagnosis not present

## 2018-01-14 MED ORDER — PREDNISONE 50 MG PO TABS: ORAL_TABLET | ORAL | 0 refills | Status: DC

## 2018-01-14 NOTE — Progress Notes (Signed)
Subjective:    I'm seeing this patient as a consultation for: Dr. Sunnie Nielsen  CC: Back and leg pain  HPI: For years this pleasant 37 year old male has a pain that he localizes in the midline of his low back with radiation down the back of the left leg to the bottom of the foot.  Moderate, persistent.  No bowel or bladder dysfunction, saddle numbness, constitutional symptoms, he is done a physician directed rehabilitation exercises for at least 6 weeks, he also notes some improvement in symptoms with voiding.  No fevers, chills, night sweats, weight loss.  I reviewed the past medical history, family history, social history, surgical history, and allergies today and no changes were needed.  Please see the problem list section below in epic for further details.  Past Medical History: No past medical history on file. Past Surgical History: Past Surgical History:  Procedure Laterality Date  . APPENDECTOMY  1989   Social History: Social History   Socioeconomic History  . Marital status: Single    Spouse name: Not on file  . Number of children: Not on file  . Years of education: Not on file  . Highest education level: Not on file  Occupational History  . Not on file  Social Needs  . Financial resource strain: Not on file  . Food insecurity:    Worry: Not on file    Inability: Not on file  . Transportation needs:    Medical: Not on file    Non-medical: Not on file  Tobacco Use  . Smoking status: Former Smoker    Last attempt to quit: 09/20/2010    Years since quitting: 7.3  . Smokeless tobacco: Former Neurosurgeon    Quit date: 09/20/2010  Substance and Sexual Activity  . Alcohol use: No    Alcohol/week: 0.0 standard drinks  . Drug use: Yes  . Sexual activity: Yes    Partners: Female  Lifestyle  . Physical activity:    Days per week: Not on file    Minutes per session: Not on file  . Stress: Not on file  Relationships  . Social connections:    Talks on phone: Not on  file    Gets together: Not on file    Attends religious service: Not on file    Active member of club or organization: Not on file    Attends meetings of clubs or organizations: Not on file    Relationship status: Not on file  Other Topics Concern  . Not on file  Social History Narrative  . Not on file   Family History: Family History  Problem Relation Age of Onset  . Bone cancer Unknown        grandmother  . Diabetes Unknown        grandfather  . Stroke Unknown        grandfathers   Allergies: No Known Allergies Medications: See med rec.  Review of Systems: No headache, visual changes, nausea, vomiting, diarrhea, constipation, dizziness, abdominal pain, skin rash, fevers, chills, night sweats, weight loss, swollen lymph nodes, body aches, joint swelling, muscle aches, chest pain, shortness of breath, mood changes, visual or auditory hallucinations.   Objective:   General: Well Developed, well nourished, and in no acute distress.  Neuro:  Extra-ocular muscles intact, able to move all 4 extremities, sensation grossly intact.  Deep tendon reflexes tested were normal. Psych: Alert and oriented, mood congruent with affect. ENT:  Ears and nose appear unremarkable.  Hearing grossly  normal. Neck: Unremarkable overall appearance, trachea midline.  No visible thyroid enlargement. Eyes: Conjunctivae and lids appear unremarkable.  Pupils equal and round. Skin: Warm and dry, no rashes noted.  Cardiovascular: Pulses palpable, no extremity edema. Back Exam:  Inspection: Unremarkable  Motion: Flexion 45 deg, Extension 45 deg, Side Bending to 45 deg bilaterally,  Rotation to 45 deg bilaterally  SLR laying: Positive straight leg raise on the left XSLR laying: Negative  Palpable tenderness: None. FABER: negative. Sensory change: Gross sensation intact to all lumbar and sacral dermatomes.  Reflexes: 2+ at both patellar tendons, 2+ at achilles tendons, Babinski's downgoing.  Strength at  foot  Plantar-flexion: 5/5 Dorsi-flexion: 5/5 Eversion: 5/5 Inversion: 5/5  Leg strength  Quad: 5/5 Hamstring: 5/5 Hip flexor: 5/5 Hip abductors: 5/5  Gait unremarkable.  Impression and Recommendations:   This case required medical decision making of moderate complexity.  Left lumbar radiculopathy L5 distribution. Prednisone, x-ray, MRI. Rehab exercises given, return for MRI results. He has failed greater than 6 weeks of physician directed conservative measures, symptoms of been present long-term. Cautioned to avoid using meloxicam more than once a day. Because he does have some dysuria we are going to add a urinalysis down to the lab. ___________________________________________ Ihor Austin. Benjamin Stain, M.D., ABFM., CAQSM. Primary Care and Sports Medicine [facility]  Adjunct Instructor of Family Medicine  [employer]

## 2018-01-14 NOTE — Assessment & Plan Note (Signed)
L5 distribution. Prednisone, x-ray, MRI. Rehab exercises given, return for MRI results. He has failed greater than 6 weeks of physician directed conservative measures, symptoms of been present long-term. Cautioned to avoid using meloxicam more than once a day. Because he does have some dysuria we are going to add a urinalysis down to the lab.

## 2018-01-15 LAB — URINALYSIS W MICROSCOPIC + REFLEX CULTURE
Bacteria, UA: NONE SEEN /HPF
Bilirubin Urine: NEGATIVE
Glucose, UA: NEGATIVE
Hgb urine dipstick: NEGATIVE
Hyaline Cast: NONE SEEN /LPF
Ketones, ur: NEGATIVE
Leukocyte Esterase: NEGATIVE
Nitrites, Initial: NEGATIVE
Protein, ur: NEGATIVE
RBC / HPF: NONE SEEN /HPF (ref 0–2)
Specific Gravity, Urine: 1.005 (ref 1.001–1.03)
Squamous Epithelial / HPF: NONE SEEN /HPF (ref <=5)
WBC, UA: NONE SEEN /HPF (ref 0–5)
pH: 6 (ref 5.0–8.0)

## 2018-01-15 LAB — NO CULTURE INDICATED

## 2018-01-28 ENCOUNTER — Ambulatory Visit (INDEPENDENT_AMBULATORY_CARE_PROVIDER_SITE_OTHER): Payer: BLUE CROSS/BLUE SHIELD

## 2018-01-28 DIAGNOSIS — M5126 Other intervertebral disc displacement, lumbar region: Secondary | ICD-10-CM

## 2018-01-28 DIAGNOSIS — M5416 Radiculopathy, lumbar region: Secondary | ICD-10-CM

## 2018-01-28 DIAGNOSIS — M545 Low back pain: Secondary | ICD-10-CM | POA: Diagnosis not present

## 2018-01-30 ENCOUNTER — Encounter: Payer: Self-pay | Admitting: Sports Medicine

## 2018-02-18 ENCOUNTER — Ambulatory Visit (INDEPENDENT_AMBULATORY_CARE_PROVIDER_SITE_OTHER): Payer: BLUE CROSS/BLUE SHIELD | Admitting: Sports Medicine

## 2018-02-18 ENCOUNTER — Encounter: Payer: Self-pay | Admitting: Sports Medicine

## 2018-02-18 DIAGNOSIS — M5416 Radiculopathy, lumbar region: Secondary | ICD-10-CM

## 2018-02-18 NOTE — Progress Notes (Signed)
Subjective:    CC: Follow-up  HPI: This is a pleasant 37 year old male, he has left lumbar radiculopathy, L5 distribution.  He has failed physical therapy, steroids, NSAIDs.  We obtained an MRI for interventional planning the results of which will be dictated below.  I reviewed the past medical history, family history, social history, surgical history, and allergies today and no changes were needed.  Please see the problem list section below in epic for further details.  Past Medical History: No past medical history on file. Past Surgical History: Past Surgical History:  Procedure Laterality Date  . APPENDECTOMY  1989   Social History: Social History   Socioeconomic History  . Marital status: Single    Spouse name: Not on file  . Number of children: Not on file  . Years of education: Not on file  . Highest education level: Not on file  Occupational History  . Not on file  Social Needs  . Financial resource strain: Not on file  . Food insecurity:    Worry: Not on file    Inability: Not on file  . Transportation needs:    Medical: Not on file    Non-medical: Not on file  Tobacco Use  . Smoking status: Former Smoker    Last attempt to quit: 09/20/2010    Years since quitting: 7.4  . Smokeless tobacco: Former Neurosurgeon    Quit date: 09/20/2010  Substance and Sexual Activity  . Alcohol use: No    Alcohol/week: 0.0 standard drinks  . Drug use: Yes  . Sexual activity: Yes    Partners: Female  Lifestyle  . Physical activity:    Days per week: Not on file    Minutes per session: Not on file  . Stress: Not on file  Relationships  . Social connections:    Talks on phone: Not on file    Gets together: Not on file    Attends religious service: Not on file    Active member of club or organization: Not on file    Attends meetings of clubs or organizations: Not on file    Relationship status: Not on file  Other Topics Concern  . Not on file  Social History Narrative  . Not  on file   Family History: Family History  Problem Relation Age of Onset  . Bone cancer Unknown        grandmother  . Diabetes Unknown        grandfather  . Stroke Unknown        grandfathers   Allergies: No Known Allergies Medications: See med rec.  Review of Systems: No fevers, chills, night sweats, weight loss, chest pain, or shortness of breath.   Objective:    General: Well Developed, well nourished, and in no acute distress.  Neuro: Alert and oriented x3, extra-ocular muscles intact, sensation grossly intact.  HEENT: Normocephalic, atraumatic, pupils equal round reactive to light, neck supple, no masses, no lymphadenopathy, thyroid nonpalpable.  Skin: Warm and dry, no rashes. Cardiac: Regular rate and rhythm, no murmurs rubs or gallops, no lower extremity edema.  Respiratory: Clear to auscultation bilaterally. Not using accessory muscles, speaking in full sentences.  MRI personally reviewed, multiple disc protrusions, worst at L4-L5, he does have a small broad-based L5-S1 protruding disc that does appear to contact the left intraspinal L5 nerve root.  Impression and Recommendations:    Left lumbar radiculopathy Improvement with prednisone, MRI does show several protruding disks, I do see some close contact of  the L5-S1 disc with the intraspinal left L5 nerve root, we can occur target this with a left L5-S1 transforaminal epidural. Return to see me 1 month after injection to evaluate response. ___________________________________________ Ihor Austin. Benjamin Stain, M.D., ABFM., CAQSM. Primary Care and Sports Medicine [facility]  Adjunct Professor of Family Medicine  [employer]

## 2018-02-18 NOTE — Assessment & Plan Note (Signed)
Improvement with prednisone, MRI does show several protruding disks, I do see some close contact of the L5-S1 disc with the intraspinal left L5 nerve root, we can occur target this with a left L5-S1 transforaminal epidural. Return to see me 1 month after injection to evaluate response.

## 2018-03-01 ENCOUNTER — Ambulatory Visit
Admission: RE | Admit: 2018-03-01 | Discharge: 2018-03-01 | Disposition: A | Discharge status: Home / Self Care | Payer: BLUE CROSS/BLUE SHIELD | Source: Ambulatory Visit | Attending: Sports Medicine | Admitting: Sports Medicine

## 2018-03-01 ENCOUNTER — Other Ambulatory Visit: Payer: Self-pay | Admitting: Sports Medicine

## 2018-03-01 DIAGNOSIS — M48061 Spinal stenosis, lumbar region without neurogenic claudication: Secondary | ICD-10-CM | POA: Diagnosis not present

## 2018-03-01 DIAGNOSIS — M5416 Radiculopathy, lumbar region: Secondary | ICD-10-CM

## 2018-03-01 MED ORDER — METHYLPREDNISOLONE ACETATE 40 MG/ML INJ SUSP (RADIOLOG
120.0000 mg | Freq: Once | INTRAMUSCULAR | Status: AC
  Administered 2018-03-01: 120 mg via EPIDURAL

## 2018-03-01 MED ORDER — IOPAMIDOL (ISOVUE-M 200) INJECTION 41%
1.0000 mL | Freq: Once | INTRAMUSCULAR | Status: AC
  Administered 2018-03-01: 1 mL via EPIDURAL

## 2018-03-01 NOTE — Discharge Instructions (Signed)

## 2018-09-16 ENCOUNTER — Encounter: Payer: Self-pay | Admitting: Sports Medicine

## 2018-09-16 ENCOUNTER — Ambulatory Visit (INDEPENDENT_AMBULATORY_CARE_PROVIDER_SITE_OTHER): Payer: BC Managed Care – PPO | Admitting: Sports Medicine

## 2018-09-16 DIAGNOSIS — M5136 Other intervertebral disc degeneration, lumbar region: Secondary | ICD-10-CM

## 2018-09-16 DIAGNOSIS — M51369 Other intervertebral disc degeneration, lumbar region without mention of lumbar back pain or lower extremity pain: Secondary | ICD-10-CM

## 2018-09-16 MED ORDER — PREDNISONE 50 MG PO TABS: ORAL_TABLET | ORAL | 0 refills | Status: DC

## 2018-09-16 MED ORDER — KETOROLAC TROMETHAMINE 30 MG/ML IJ SOLN
30.0000 mg | Freq: Once | INTRAMUSCULAR | Status: AC
  Administered 2018-09-16: 15:00:00 30 mg via INTRAMUSCULAR

## 2018-09-16 NOTE — Addendum Note (Signed)
Addended by: Jamesetta So on: 09/16/2018 02:44 PM   Modules accepted: Orders

## 2018-09-16 NOTE — Progress Notes (Signed)
Subjective:    CC: Acute low back pain  HPI: [name], he has a history of lumbar DDD, this resolved with a left L5-S1 transforaminal epidural sometime last year.  He did extremely well until recently, now has recurrence of pain, axial, localized in the right side low back, radiation into the buttock, nothing past the thigh or knee.  No bowel or bladder dysfunction, saddle numbness, constitutional symptoms.  I reviewed the past medical history, family history, social history, surgical history, and allergies today and no changes were needed.  Please see the problem list section below in epic for further details.  Past Medical History: No past medical history on file. Past Surgical History: Past Surgical History:  Procedure Laterality Date  . APPENDECTOMY  1989   Social History: Social History   Socioeconomic History  . Marital status: Single    Spouse name: Not on file  . Number of children: Not on file  . Years of education: Not on file  . Highest education level: Not on file  Occupational History  . Not on file  Social Needs  . Financial resource strain: Not on file  . Food insecurity:    Worry: Not on file    Inability: Not on file  . Transportation needs:    Medical: Not on file    Non-medical: Not on file  Tobacco Use  . Smoking status: Former Smoker    Last attempt to quit: 09/20/2010    Years since quitting: 7.9  . Smokeless tobacco: Former Systems developer    Quit date: 09/20/2010  Substance and Sexual Activity  . Alcohol use: No    Alcohol/week: 0.0 standard drinks  . Drug use: Yes  . Sexual activity: Yes    Partners: Female  Lifestyle  . Physical activity:    Days per week: Not on file    Minutes per session: Not on file  . Stress: Not on file  Relationships  . Social connections:    Talks on phone: Not on file    Gets together: Not on file    Attends religious service: Not on file    Active member of club or organization: Not on file     Attends meetings of clubs or organizations: Not on file    Relationship status: Not on file  Other Topics Concern  . Not on file  Social History Narrative  . Not on file   Family History: Family History  Problem Relation Age of Onset  . Bone cancer Unknown        grandmother  . Diabetes Unknown        grandfather  . Stroke Unknown        grandfathers   Allergies: No Known Allergies Medications: See med rec.  Review of Systems: No fevers, chills, night sweats, weight loss, chest pain, or shortness of breath.   Objective:    General: Well Developed, well nourished, and in no acute distress.  Neuro: Alert and oriented x3, extra-ocular muscles intact, sensation grossly intact.  HEENT: Normocephalic, atraumatic, pupils equal round reactive to light, neck supple, no masses, no lymphadenopathy, thyroid nonpalpable.  Skin: Warm and dry, no rashes. Cardiac: Regular rate and rhythm, no murmurs rubs or gallops, no lower extremity edema.  Respiratory: Clear to auscultation bilaterally. Not using accessory muscles, speaking in full sentences. Back Exam:  Inspection: Unremarkable  Motion: Flexion 45 deg, Extension 45 deg, Side Bending to 45 deg bilaterally,  Rotation to 45 deg bilaterally  SLR laying: Negative  XSLR laying: Negative  Palpable tenderness: None. FABER: negative. Sensory change: Gross sensation intact to all lumbar and sacral dermatomes.  Reflexes: 2+ at both patellar tendons, 2+ at achilles tendons, Babinski's downgoing.  Strength at foot  Plantar-flexion: 5/5 Dorsi-flexion: 5/5 Eversion: 5/5 Inversion: 5/5  Leg strength  Quad: 5/5 Hamstring: 5/5 Hip flexor: 5/5 Hip abductors: 5/5  Gait unremarkable.  Impression and Recommendations:    Lumbar degenerative disc disease Did well last year with a left L5-S1 transforaminal epidural. Today pain is axial, discogenic, right-sided. Toradol 30 intramuscular, prednisone daily for 5 days. He does have a moderate sized  L4-L5 disc protrusion. We are going to target this with a right L4-L5 interlaminar epidural. Return to see me 1 month after the injection to evaluate relief.   ___________________________________________ Ihor Austinhomas J. Benjamin Stainhekkekandam, M.D., ABFM., CAQSM. Primary Care and Sports Medicine [facility]  Adjunct Professor of Family Medicine  [employer]

## 2018-09-16 NOTE — Assessment & Plan Note (Signed)
Did well last year with a left L5-S1 transforaminal epidural. Today pain is axial, discogenic, right-sided. Toradol 30 intramuscular, prednisone daily for 5 days. He does have a moderate sized L4-L5 disc protrusion. We are going to target this with a right L4-L5 interlaminar epidural. Return to see me 1 month after the injection to evaluate relief.

## 2018-10-02 ENCOUNTER — Other Ambulatory Visit: Payer: Self-pay

## 2018-10-02 ENCOUNTER — Ambulatory Visit
Admission: RE | Admit: 2018-10-02 | Discharge: 2018-10-02 | Disposition: A | Discharge status: Home / Self Care | Payer: BC Managed Care – PPO | Source: Ambulatory Visit | Attending: Sports Medicine | Admitting: Sports Medicine

## 2018-10-02 MED ORDER — METHYLPREDNISOLONE ACETATE 40 MG/ML INJ SUSP (RADIOLOG
120.0000 mg | Freq: Once | INTRAMUSCULAR | Status: AC
  Administered 2018-10-02: 12:00:00 120 mg via EPIDURAL

## 2018-10-02 MED ORDER — IOPAMIDOL (ISOVUE-M 200) INJECTION 41%
1.0000 mL | Freq: Once | INTRAMUSCULAR | Status: AC
  Administered 2018-10-02: 1 mL via EPIDURAL

## 2018-10-02 NOTE — Discharge Instructions (Signed)

## 2018-11-24 ENCOUNTER — Encounter: Payer: Self-pay | Admitting: Sports Medicine

## 2018-11-24 ENCOUNTER — Encounter: Payer: Self-pay | Admitting: Osteopathic Medicine

## 2018-11-24 DIAGNOSIS — M51369 Other intervertebral disc degeneration, lumbar region without mention of lumbar back pain or lower extremity pain: Secondary | ICD-10-CM

## 2018-11-24 DIAGNOSIS — M5136 Other intervertebral disc degeneration, lumbar region: Secondary | ICD-10-CM

## 2018-11-25 NOTE — Telephone Encounter (Signed)
Epidural ordered, please contact [facility] for scheduling. 

## 2018-11-25 NOTE — Telephone Encounter (Signed)
Roberta advised.

## 2018-12-03 ENCOUNTER — Other Ambulatory Visit: Payer: Self-pay

## 2018-12-03 ENCOUNTER — Ambulatory Visit
Admission: RE | Admit: 2018-12-03 | Discharge: 2018-12-03 | Disposition: A | Discharge status: Home / Self Care | Payer: BC Managed Care – PPO | Source: Ambulatory Visit | Attending: Sports Medicine | Admitting: Sports Medicine

## 2018-12-03 MED ORDER — METHYLPREDNISOLONE ACETATE 40 MG/ML INJ SUSP (RADIOLOG
120.0000 mg | Freq: Once | INTRAMUSCULAR | Status: AC
  Administered 2018-12-03: 120 mg via EPIDURAL

## 2018-12-03 MED ORDER — IOPAMIDOL (ISOVUE-M 200) INJECTION 41%
1.0000 mL | Freq: Once | INTRAMUSCULAR | Status: AC
  Administered 2018-12-03: 09:00:00 1 mL via EPIDURAL

## 2018-12-03 NOTE — Discharge Instructions (Signed)

## 2019-03-10 ENCOUNTER — Other Ambulatory Visit: Payer: Self-pay

## 2019-03-10 ENCOUNTER — Encounter: Payer: Self-pay | Admitting: Osteopathic Medicine

## 2019-03-10 ENCOUNTER — Ambulatory Visit (INDEPENDENT_AMBULATORY_CARE_PROVIDER_SITE_OTHER): Payer: BC Managed Care – PPO | Admitting: Osteopathic Medicine

## 2019-03-10 VITALS — BP 138/89 | HR 92 | Temp 97.8°F | Wt 240.0 lb

## 2019-03-10 DIAGNOSIS — Z0000 Encounter for general adult medical examination without abnormal findings: Secondary | ICD-10-CM

## 2019-03-10 DIAGNOSIS — G43109 Migraine with aura, not intractable, without status migrainosus: Secondary | ICD-10-CM | POA: Diagnosis not present

## 2019-03-10 MED ORDER — SUMATRIPTAN SUCCINATE 50 MG PO TABS: 25.0000 mg | ORAL_TABLET | ORAL | 1 refills | Status: DC | PRN

## 2019-03-10 NOTE — Progress Notes (Signed)
HPI: Christopher [name] is a 38 y.o. male who  has no past medical history on file.  he presents to [facility] today, 03/10/19,  for chief complaint of:  Requests allergy testing   Acetone triggers his migraines w aura, he's usually able to avoid this but would like to confirm it w/ allergy testing or see if any other triggers he might not know about. Unilateral headache throbbing pain w/ visual aura and photo/phonosensitivity maybe once or twice per year.         At today's visit 03/10/19 ... PMH, PSH, FH reviewed and updated as needed.  Current medication list and allergy/intolerance hx reviewed and updated as needed. (See remainder of HPI, ROS, Phys Exam below)   No results found.  No results found for this or any previous visit (from the past 72 hour(s)).        ASSESSMENT/PLAN: The primary encounter diagnosis was Migraine with aura and without status migrainosus, not intractable. A diagnosis of Annual physical exam was also pertinent to this visit.  Advised not really "allergy" related, I wouldn't advise testing, just continue to avoid triggers, can trial Imitrex as needed   Labs ordered for future visit. Annual physical / preventive care was NOT performed or billed today.    Orders Placed This Encounter  Procedures  . CBC  . COMPLETE METABOLIC PANEL WITH GFR  . LIPID SCREENING     Meds ordered this encounter  Medications  . SUMAtriptan (IMITREX) 50 MG tablet    Sig: Take 0.5-1 tablets (25-50 mg total) by mouth every 2 (two) hours as needed for migraine. Max 3 pills in 24 hours, and 5 days in the span of a month    Dispense:  10 tablet    Refill:  1    Patient Instructions  General Preventive Care  Most recent routine screening lipids/other labs: ordered  Everyone should have blood pressure checked once per year.   Tobacco: don't!   Alcohol: responsible moderation is ok for most adults - if you have concerns about  your alcohol intake, please talk to me!   Exercise: as tolerated to reduce risk of cardiovascular disease and diabetes. Strength training will also prevent osteoporosis.   Mental health: if need for mental health care (medicines, counseling, other), or concerns about moods, please let me know!   Sexual health: if need for STD testing, or if concerns with libido/pain problems, please let me know!   Advanced Directive: Living Will and/or Healthcare Power of Attorney recommended for all adults, regardless of age or health.  Vaccines  Flu vaccine: recommended for almost everyone, every fall.   Shingles vaccine: Shingrix recommended after age 55.  Pneumonia vaccines: Prevnar and Pneumovax recommended after age 59, or sooner if certain medical conditions.  Tetanus booster: Tdap recommended every 10 years.  Cancer screenings   Colon cancer screening: recommended for everyone at age 71  Prostate cancer screening: PSA blood test around age 44  Lung cancer screening: not needed as long as you don't take up smoking!  Infection screenings . HIV: recommended screening at least once age 73-65, more often as needed. . Gonorrhea/Chlamydia: screening as needed . Hepatitis C: recommended for anyone born 19-1966 . TB: certain at-risk populations, or depending on work requirements and/or travel history Other . Bone Density Test: recommended for men at age 51, sooner depending on risk factors . Abdominal Aortic Aneurysm: screening with ultrasound recommended once for men age 58-75 who have ever smoked  Follow-up plan: Return in about 1 year (around 03/09/2020) for [place] (get labs prior to visit, orders are  in).                                                 ################################################# ################################################# ################################################# #################################################    No outpatient medications have been marked as taking for the 03/10/19 encounter (Office Visit) with Sunnie Nielsen, DO.    No Known Allergies     Review of Systems:  Constitutional: No recent illness  HEENT: +occasional migraine headache, no vision change  Cardiac: No  chest pain, No  pressure, No palpitations  Respiratory:  No  shortness of breath. No  Cough  Gastrointestinal: No  abdominal pain, no change on bowel habits  Neurologic: No  weakness, No  Dizziness  Psychiatric: No  concerns with depression, No  concerns with anxiety  Exam:  BP 138/89 (BP Location: Left Arm, Patient Position: Sitting, Cuff Size: Normal)   Pulse 92   Temp 97.8 F (36.6 C) (Oral)   Wt 240 lb 0.6 oz (108.9 kg)   BMI 32.56 kg/m   Constitutional: VS see above. General Appearance: alert, well-developed, well-nourished, NAD  Eyes: Normal lids and conjunctive, non-icteric sclera  Neck: No masses, trachea midline.   Respiratory: Normal respiratory effort. no wheeze, no rhonchi, no rales  Cardiovascular: S1/S2 normal, no murmur, no rub/gallop auscultated. RRR.   Musculoskeletal: Gait normal. Symmetric and independent movement of all extremities  Neurological: Normal balance/coordination. No tremor.  Skin: warm, dry, intact.   Psychiatric: Normal judgment/insight. Normal mood and affect. Oriented x3.       Visit summary with medication list and pertinent instructions was printed for patient to review, patient was advised to alert [place] if any updates are needed. All questions at time of visit were answered - patient instructed to contact office with any additional concerns. ER/RTC  precautions were reviewed with the patient and understanding verbalized.   Note: Total time spent 25 minutes, greater than 50% of the visit was spent face-to-face counseling and coordinating care for the following: The primary encounter diagnosis was Migraine with aura and without status migrainosus, not intractable. .  Please note: voice recognition software was used to produce this document, and typos may escape review. Please contact Dr. Lyn Hollingshead for any needed clarifications.    Follow up plan: No follow-ups on file.

## 2019-03-10 NOTE — Patient Instructions (Signed)
General Preventive Care  Most recent routine screening lipids/other labs: ordered  Everyone should have blood pressure checked once per year.   Tobacco: don't!   Alcohol: responsible moderation is ok for most adults - if you have concerns about your alcohol intake, please talk to me!   Exercise: as tolerated to reduce risk of cardiovascular disease and diabetes. Strength training will also prevent osteoporosis.   Mental health: if need for mental health care (medicines, counseling, other), or concerns about moods, please let me know!   Sexual health: if need for STD testing, or if concerns with libido/pain problems, please let me know!   Advanced Directive: Living Will and/or Healthcare Power of Attorney recommended for all adults, regardless of age or health.  Vaccines  Flu vaccine: recommended for almost everyone, every fall.   Shingles vaccine: Shingrix recommended after age 41.  Pneumonia vaccines: Prevnar and Pneumovax recommended after age 57, or sooner if certain medical conditions.  Tetanus booster: Tdap recommended every 10 years.  Cancer screenings   Colon cancer screening: recommended for everyone at age 98  Prostate cancer screening: PSA blood test around age 76  Lung cancer screening: not needed as long as you don't take up smoking!  Infection screenings . HIV: recommended screening at least once age 23-65, more often as needed. . Gonorrhea/Chlamydia: screening as needed . Hepatitis C: recommended for anyone born 68-1966 . TB: certain at-risk populations, or depending on work requirements and/or travel history Other . Bone Density Test: recommended for men at age 32, sooner depending on risk factors . Abdominal Aortic Aneurysm: screening with ultrasound recommended once for men age 68-75 who have ever smoked

## 2020-05-30 ENCOUNTER — Encounter: Payer: Self-pay | Admitting: Osteopathic Medicine

## 2020-05-30 DIAGNOSIS — R454 Irritability and anger: Secondary | ICD-10-CM

## 2020-06-11 ENCOUNTER — Encounter: Payer: Self-pay | Admitting: Osteopathic Medicine

## 2020-06-15 NOTE — Telephone Encounter (Signed)
Sent referral to [facility] they do treat anger management and it is in network with patients insurance - [facility]

## 2020-09-13 ENCOUNTER — Ambulatory Visit (INDEPENDENT_AMBULATORY_CARE_PROVIDER_SITE_OTHER): Payer: BC Managed Care – PPO

## 2020-09-13 ENCOUNTER — Ambulatory Visit (INDEPENDENT_AMBULATORY_CARE_PROVIDER_SITE_OTHER): Payer: BC Managed Care – PPO | Admitting: Medical-Surgical

## 2020-09-13 ENCOUNTER — Other Ambulatory Visit: Payer: Self-pay

## 2020-09-13 ENCOUNTER — Encounter: Payer: Self-pay | Admitting: Medical-Surgical

## 2020-09-13 VITALS — BP 141/82 | HR 89 | Temp 98.5°F | Ht 72.0 in | Wt 229.5 lb

## 2020-09-13 DIAGNOSIS — S0990XA Unspecified injury of head, initial encounter: Secondary | ICD-10-CM | POA: Diagnosis not present

## 2020-09-13 NOTE — Patient Instructions (Signed)
Concussion, Adult  A concussion is a brain injury from a hard, direct hit (trauma) to your head or body. This direct hit causes your brain to quickly shake back and forth inside your skull. A concussion may also be called a mild traumatic brain injury (TBI). Healing from this injury can take time. What are the causes? This condition is caused by:  A direct hit to your head, such as: ? Running into a player during a game. ? Being hit in a fight. ? Hitting your head on a hard surface.  A quick and sudden movement of the head or neck, such as in a car crash. What are the signs or symptoms? The signs of a concussion can be hard to notice. They may be missed by you, family members, and doctors. You may look fine on the outside but may not act or feel normal. Physical symptoms  Headaches.  Being dizzy.  Problems with body balance.  Being sensitive to light or noise.  Vomiting or feeling like you may vomit.  Being tired.  Problems seeing or hearing.  Not sleeping or eating as you used to.  Seizure. Mental and emotional symptoms  Feeling grouchy (irritable).  Having mood changes.  Problems remembering things.  Trouble focusing your mind (concentrating), organizing, or making decisions.  Being slow to think, act, react, speak, or read.  Feeling worried or nervous (anxious).  Feeling sad (depressed). How is this treated? This condition may be treated by:  Stopping sports or activity if you are injured. If you hit your head or have signs of concussion: ? Do not return to sports or activities the same day. ? Get checked by a doctor before you return to your activities.  Resting your body and your mind.  Being watched carefully, often at home.  Medicines to help with symptoms such as: ? Headaches. ? Feeling like you may vomit. ? Problems with sleep.  Avoiding alcohol and drugs.  Being asked to go to a concussion clinic or a place to help you recover  (rehabilitation center). Recovery from a concussion can take time. Return to activities only:  When you are fully healed.  When your doctor says it is safe. Avoid taking strong pain medicines (opioids) for a concussion. Follow these instructions at home: Activity  Limit activities that need a lot of thought or focus, such as: ? Homework or work for your job. ? Watching TV. ? Using the computer or phone. ? Playing memory games and puzzles.  Rest. Rest helps your brain heal. Make sure you: ? Get plenty of sleep. Most adults should get 7-9 hours of sleep each night. ? Rest during the day. Take naps or breaks when you feel tired.  Avoid activity like exercise until your doctor says its safe. Stop any activity that makes symptoms worse.  Do not do activities that could cause a second concussion, such as riding a bike or playing sports.  Ask your doctor when you can return to your normal activities, such as school, work, sports, and driving. Your ability to react may be slower. Do not do these activities if you are dizzy. General instructions  Take over-the-counter and prescription medicines only as told by your doctor.  Do not drink alcohol until your doctor says you can.  Watch your symptoms and tell other people to do the same. Other problems can occur after a concussion. Older adults have a higher risk of serious problems.  Tell your work manager, teachers, school nurse, school   counselor, coach, or athletic trainer about your injury and symptoms. Tell them about what you can or cannot do.  Keep all follow-up visits as told by your doctor. This is important.   How is this prevented? It is very important that you do not get another brain injury. In rare cases, another injury can cause brain damage that will not go away, brain swelling, or death. The risk of this is greatest in the first 7-10 days after a head injury. To avoid injuries:  Stop activities that could lead to a second  concussion, such as contact sports, until your doctor says it is okay.  When you return to sports or activities: ? Do not crash into other players. This is how most concussions happen. ? Follow the rules. ? Respect other players. Do not engage in violent behavior while playing.  Get regular exercise. Do strength and balance training.  Wear a helmet that fits you well during sports, biking, or other activities.  Helmets can help protect you from serious skull and brain injuries, but they do not protect you from a concussion. Even when wearing a helmet, you should avoid being hit in the head. Contact a doctor if:  Your symptoms do not get better.  You have new symptoms.  You have another injury. Get help right away if:  You have bad headaches or your headaches get worse.  You feel weak or numb in any part of your body.  You feel mixed up (confused).  Your balance gets worse.  You vomit often.  You feel more sleepy than normal.  You cannot speak well, or have slurred speech.  You have a seizure.  Others have trouble waking you up.  You have changes in how you act.  You have changes in how you see (vision).  You pass out (lose consciousness). These symptoms may be an emergency. Do not wait to see if the symptoms will go away. Get medical help right away. Call your local emergency services (911 in the [place] Do not drive yourself to the hospital. Summary  A concussion is a brain injury from a hard, direct hit (trauma) to your head or body.  This condition is treated with rest and careful watching of symptoms.  Ask your doctor when you can return to your normal activities, such as school, work, or driving.  Get help right away if you have a very bad headache, feel weak in any part of your body, have a seizure, have changes in how you act or see, or if you are mixed up or more sleepy than normal. This information is not intended to replace advice given to you by your  health care provider. Make sure you discuss any questions you have with your health care provider. Document Revised: 02/06/2019 Document Reviewed: 02/06/2019 Elsevier Patient Education  2021 Elsevier Inc.  

## 2020-09-13 NOTE — Progress Notes (Signed)
Subjective:    CC: Fall with head trauma  HPI: Pleasant 40 year old male presenting today after having a fall on Saturday morning 2 days ago.  He slipped and tried to catch himself on the desk but ended up hitting his forehead.  He did not have any loss of consciousness but notes that memory of the 2 hours after his head injury are a bit fuzzy.  He has been having slight headaches since the incident as well as a bit of disorientation.  Reports that he knows where he is but has been disoriented feeling.  Is also had episodes of getting a bit dizzy.  He went to work this morning but was unable to focus and concentrate as his job requires.  Feels that he is having trouble with executive mental function.  Denies vision changes, drainage from ears/nose, nausea, vomiting, chest pain, shortness of breath, gait disturbance, and weakness/paresthesias of the extremities.  I reviewed the past medical history, family history, social history, surgical history, and allergies today and no changes were needed.  Please see the problem list section below in epic for further details.  Past Medical History: History reviewed. No pertinent past medical history. Past Surgical History: Past Surgical History:  Procedure Laterality Date  . APPENDECTOMY  1989   Social History: Social History   Socioeconomic History  . Marital status: Single    Spouse name: Not on file  . Number of children: Not on file  . Years of education: Not on file  . Highest education level: Not on file  Occupational History  . Not on file  Tobacco Use  . Smoking status: Former Smoker    Quit date: 09/20/2010    Years since quitting: 9.9  . Smokeless tobacco: Former Neurosurgeon    Quit date: 09/20/2010  Vaping Use  . Vaping Use: Never used  Substance and Sexual Activity  . Alcohol use: No    Alcohol/week: 0.0 standard drinks  . Drug use: Yes  . Sexual activity: Yes    Partners: Female  Other Topics Concern  . Not on file  Social  History Narrative  . Not on file   Social Determinants of Health   Financial Resource Strain: Not on file  Food Insecurity: Not on file  Transportation Needs: Not on file  Physical Activity: Not on file  Stress: Not on file  Social Connections: Not on file   Family History: Family History  Problem Relation Age of Onset  . Bone cancer Other        grandmother  . Diabetes Other        grandfather  . Stroke Other        grandfathers   Allergies: No Known Allergies Medications: See med rec.  Review of Systems: See HPI for pertinent positives and negatives.   Objective:    General: Well Developed, well nourished, and in no acute distress.  Neuro: Alert and oriented x3, extra-ocular muscles intact, sensation grossly intact.  HEENT: Normocephalic, atraumatic, pupils equal round reactive to light.  Skin: Warm and dry, 1.5-2 inch scabbed linear abrasion to the middle forehead, no erythema, edema, or drainage. Tender to palpation of the surrounding area.  Cardiac: Regular rate and rhythm, no murmurs rubs or gallops, no lower extremity edema.  Respiratory: Clear to auscultation bilaterally. Not using accessory muscles, speaking in full sentences.   Impression and Recommendations:    1. Injury of head, initial encounter Presentation consistent with mild concussion.  Since he does have some retrograde amnesia  of the 2 hours after his incident as well as difficulty with his mental function, we will go ahead and get a stat head CT today.  Post concussion symptoms discussed as well as timeline for resolution.  Work note provided to have him out of work for this week and return on June 13th to allow for cognitive rest.  Information on concussions and emergency symptoms to monitor for provided via AVS. - CT Head Wo Contrast; Future  Return if symptoms worsen or fail to improve. ___________________________________________ Thayer Ohm, DNP, APRN, FNP-BC Primary Care and Sports  [facility] [place]

## 2020-10-19 ENCOUNTER — Ambulatory Visit (INDEPENDENT_AMBULATORY_CARE_PROVIDER_SITE_OTHER): Payer: BC Managed Care – PPO | Admitting: Osteopathic Medicine

## 2020-10-19 ENCOUNTER — Other Ambulatory Visit: Payer: Self-pay

## 2020-10-19 ENCOUNTER — Ambulatory Visit: Payer: BC Managed Care – PPO | Admitting: Osteopathic Medicine

## 2020-10-19 ENCOUNTER — Encounter: Payer: Self-pay | Admitting: Osteopathic Medicine

## 2020-10-19 VITALS — BP 147/99 | HR 75 | Ht 72.0 in | Wt 231.0 lb

## 2020-10-19 DIAGNOSIS — L0291 Cutaneous abscess, unspecified: Secondary | ICD-10-CM | POA: Diagnosis not present

## 2020-10-19 NOTE — Progress Notes (Signed)
Christopher [name] is a 40 y.o. male who presents to  [facility]  today, 10/20/20, seeking care for the following:  Skin concern on penis - lump present about a few mos, at one point he poked it with needle and drained pus, no skin breakdown, no itching, no ulceration. Denies new sexual partner(s) in the past year, not sexually active since last tested  On exam, firm nontender mass in R scrotum skin, no fluctuance, no erythema     ASSESSMENT & PLAN with other pertinent findings:  The encounter diagnosis was Abscess. Given history and exam today, this sounds like healing abscess likely ingrown hair or similar etiology or just spontaneous. Decliend STI testing, exam not concerning for HSV, HPV, Syphilis. Pt reassured and will f/u prn     There are no Patient Instructions on file for this visit.  No orders of the defined types were placed in this encounter.   No orders of the defined types were placed in this encounter.    See below for relevant physical exam findings  See below for recent lab and imaging results reviewed  Medications, allergies, PMH, PSH, SocH, FamH reviewed below    Follow-up instructions: No follow-ups on file.                                        Exam:  BP (!) 147/99   Pulse 75   Ht 6' (1.829 m)   Wt 231 lb (104.8 kg)   SpO2 96%   BMI 31.33 kg/m  Constitutional: VS see above. General Appearance: alert, well-developed, well-nourished, NAD Neck: No masses, trachea midline.  Respiratory: Normal respiratory effort.  Musculoskeletal: Gait normal. Symmetric and independent movement of all extremities Neurological: Normal balance/coordination. No tremor. Skin: warm, dry, intact. See aboev Psychiatric: Normal judgment/insight. Normal mood and affect. Oriented x3.   No outpatient medications have been marked as taking for the 10/19/20 encounter (Office Visit) with  Sunnie Nielsen, DO.    No Known Allergies  Patient Active Problem List   Diagnosis Date Noted   Left ankle pain 05/23/2017   Lumbar degenerative disc disease 03/29/2015    Family History  Problem Relation Age of Onset   Bone cancer Other        grandmother   Diabetes Other        grandfather   Stroke Other        grandfathers    Social History   Tobacco Use  Smoking Status Former   Pack years: 0.00   Types: Cigarettes   Quit date: 09/20/2010   Years since quitting: 10.0  Smokeless Tobacco Former   Quit date: 09/20/2010    Past Surgical History:  Procedure Laterality Date   APPENDECTOMY  1989    Immunization History  Administered Date(s) Administered   Influenza-Unspecified 06/26/2017, 01/09/2019   PFIZER(Purple Top)SARS-COV-2 Vaccination 07/03/2019, 07/24/2019, 03/09/2020   Tdap 05/20/2015    No results found for this or any previous visit (from the past 2160 hour(s)).  No results found.     All questions at time of visit were answered - patient instructed to contact office with any additional concerns or updates. ER/RTC precautions were reviewed with the patient as applicable.   Please note: manual typing as well as voice recognition software may have been used to produce this document - typos may escape review. Please contact  Dr. Sheppard Coil for any needed clarifications.

## 2020-10-21 ENCOUNTER — Ambulatory Visit: Payer: BC Managed Care – PPO | Admitting: Osteopathic Medicine

## 2021-01-24 ENCOUNTER — Ambulatory Visit: Payer: BC Managed Care – PPO | Admitting: Family Medicine

## 2021-08-16 ENCOUNTER — Ambulatory Visit: Payer: BC Managed Care – PPO | Admitting: Medical-Surgical

## 2021-08-23 ENCOUNTER — Ambulatory Visit (INDEPENDENT_AMBULATORY_CARE_PROVIDER_SITE_OTHER): Payer: No Typology Code available for payment source | Admitting: Sports Medicine

## 2021-08-23 DIAGNOSIS — M7662 Achilles tendinitis, left leg: Secondary | ICD-10-CM | POA: Diagnosis not present

## 2021-08-23 MED ORDER — NITROGLYCERIN 0.2 MG/HR TD PT24: MEDICATED_PATCH | TRANSDERMAL | 11 refills | Status: AC

## 2021-08-23 NOTE — Assessment & Plan Note (Signed)
Pleasant 41 year old male, increasing pain left Achilles at the calcaneal insertion, he has been doing some more walking than typical. ?No tenderness mid Achilles, no tenderness retrocalcaneal bursa, no tenderness tibialis posterior. ?Good motion, good strength, negative Thompson's test bilaterally. ?Adding bilateral heel lifts, topical nitroglycerin, formal physical therapy for eccentric conditioning, return to see me in 6 weeks, retrocalcaneal bursa injection if not better. ?

## 2021-08-23 NOTE — Progress Notes (Signed)
? ? ?  Procedures performed today:   ? ?None. ? ?Independent interpretation of notes and tests performed by another provider:  ? ?None. ? ?Brief History, Exam, Impression, and Recommendations:   ? ?Left insertional Achilles tendinosis ?Pleasant 41 year old male, increasing pain left Achilles at the calcaneal insertion, he has been doing some more walking than typical. ?No tenderness mid Achilles, no tenderness retrocalcaneal bursa, no tenderness tibialis posterior. ?Good motion, good strength, negative Thompson's test bilaterally. ?Adding bilateral heel lifts, topical nitroglycerin, formal physical therapy for eccentric conditioning, return to see me in 6 weeks, retrocalcaneal bursa injection if not better. ? ? ? ?___________________________________________ ?Gwen Her. Dianah Field, M.D., ABFM., CAQSM. ?Primary Care and Sports Medicine ?[place] ? ?Adjunct Instructor of Family Medicine  ?[employer] ?

## 2021-09-14 ENCOUNTER — Encounter: Payer: Self-pay | Admitting: Physical Therapy

## 2021-09-14 ENCOUNTER — Ambulatory Visit: Payer: No Typology Code available for payment source | Attending: Sports Medicine | Admitting: Physical Therapy

## 2021-09-14 DIAGNOSIS — M7662 Achilles tendinitis, left leg: Secondary | ICD-10-CM | POA: Insufficient documentation

## 2021-09-14 DIAGNOSIS — M25572 Pain in left ankle and joints of left foot: Secondary | ICD-10-CM | POA: Diagnosis present

## 2021-09-14 DIAGNOSIS — R29898 Other symptoms and signs involving the musculoskeletal system: Secondary | ICD-10-CM | POA: Insufficient documentation

## 2021-09-14 NOTE — Therapy (Signed)
[facility] [place] [address] [place], [place], [address] Phone: [phone]   Fax:  [phone]  Physical Therapy Evaluation  Patient Details  Name: Christopher [name] MRN: [id number] Date of Birth: 12/20/1980 Referring Provider (PT): Benjamin Stain   Encounter Date: 09/14/2021 Rationale for Evaluation and Treatment Rehabilitation   PT End of Session - 09/14/21 1603     Visit Number 1    Number of Visits 6    Date for PT Re-Evaluation 10/26/21    PT Start Time 1530    PT Stop Time 1604    PT Time Calculation (min) 34 min    Activity Tolerance Patient tolerated treatment well    Behavior During Therapy [facility] for tasks assessed/performed             History reviewed. No pertinent past medical history.  Past Surgical History:  Procedure Laterality Date   APPENDECTOMY  1989    There were no vitals filed for this visit.    Subjective Assessment - 09/14/21 1532     Subjective Pt has been having years long problem with ankle pain. He has been to MD in the past but was recently diagnosed with achilles tendonitis and prescribled PT. Pain increases with prolonged sitting. He was prescribed nitroglycerin patch which has helped, also wearing heel pads which have helped.    Pertinent History OA, DDD L5    How long can you sit comfortably? 15 minutes    How long can you walk comfortably? 15-20 minutes    Diagnostic tests x ray negative for fracture    Patient Stated Goals return to walking for exercise    Currently in Pain? Yes    Pain Score 1     Pain Location Ankle    Pain Orientation Left    Pain Descriptors / Indicators Sore    Pain Type Chronic pain    Aggravating Factors  prolonged sitting, prolonged walking    Pain Relieving Factors meds, heel pads in shoes                [facility] PT Assessment - 09/14/21 0001       Assessment   Medical Diagnosis Lt achilles tendonitis    Referring Provider (PT) Thekkekandam     Onset Date/Surgical Date 07/19/21    Next MD Visit 3 weeks      Precautions   Precautions None      Restrictions   Weight Bearing Restrictions No      Balance Screen   Has the patient fallen in the past 6 months No      Prior Function   Level of Independence Independent    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 [employer]  63      Functional Tests   Functional tests Single leg stance      Single Leg Stance   Comments 20 sec bilat      ROM / Strength   AROM / PROM / Strength AROM;Strength      AROM   AROM Assessment Site Ankle    Right/Left Ankle Left;Right    Right Ankle Dorsiflexion 8    Right Ankle Plantar Flexion 50    Right Ankle Inversion 30    Right Ankle Eversion 10    Left Ankle Dorsiflexion 2    Left Ankle Plantar Flexion 50    Left Ankle Inversion 30    Left  Ankle Eversion 10      Strength   Strength Assessment Site Ankle    Right/Left Ankle Right;Left    Right Ankle Dorsiflexion 4+/5    Right Ankle Plantar Flexion 4+/5    Right Ankle Inversion 4+/5    Right Ankle Eversion 4+/5    Left Ankle Dorsiflexion 4+/5    Left Ankle Plantar Flexion 4+/5    Left Ankle Inversion 4+/5    Left Ankle Eversion 4+/5      Palpation   Palpation comment hypomobile TC mobs, hyopmobile great toe, metatarsals                        Objective measurements completed on examination: See above findings.       [facility] Adult PT Treatment/Exercise - 09/14/21 0001       Exercises   Exercises Ankle      Ankle Exercises: Stretches   Soleus Stretch 2 reps;30 seconds    Gastroc Stretch 2 reps;30 seconds    Other Stretch seated great toe self stretch 2 x 30 sec      Ankle Exercises: Seated   Other Seated Ankle Exercises toe yoga x 10                     PT Education - 09/14/21 1600     Education Details PT POC and goals, HEP    Person(s) Educated Patient     Methods Demonstration;Explanation;Handout    Comprehension Returned demonstration;Verbalized understanding                 PT Long Term Goals - 09/14/21 1607       PT LONG TERM GOAL #1   Title Pt will be independent with HEP    Time 6    Period Weeks    Status New    Target Date 10/26/21      PT LONG TERM GOAL #2   Title Pt will improve Lt ankle DF to = Rt ankle to improve mobility    Time 6    Period Weeks    Status New    Target Date 10/26/21      PT LONG TERM GOAL #3   Title Pt will tolerate walking x 2 miles with Lt ankle pain <= 1/10 to return to exercise program    Time 6    Period Weeks    Status New    Target Date 10/26/21      PT LONG TERM GOAL #4   Title pt will improve FOTO to >= 71 to demo improved functional mobility    Time 6    Period Weeks    Status New    Target Date 10/26/21                    Plan - 09/14/21 1605     Clinical Impression Statement Pt is a 41 y/o male referred for Lt achilles tendonitis. Pt presents with decreased ankle and foot mobility and ROM. Decreased activity tolerance, increased pain. Pt will benefit from skilled PT to address deficits and return to full functional mobility.    Personal Factors and Comorbidities Time since onset of injury/illness/exacerbation;Fitness    Examination-Activity Limitations Sit;Stand;Locomotion Level    Examination-Participation Restrictions Community Activity    Stability/Clinical Decision Making Stable/Uncomplicated    Clinical Decision Making Low    Rehab Potential Good    PT Frequency 1x / week    PT Duration  6 weeks    PT Treatment/Interventions Aquatic Therapy;Cryotherapy;Iontophoresis 4mg /ml Dexamethasone;Electrical Stimulation;Moist Heat;Balance training;Neuromuscular re-education;Therapeutic exercise;Therapeutic activities;Patient/family education;Manual techniques;Passive range of motion;Dry needling;Taping;Vasopneumatic Device    PT Next Visit Plan assess HEP,  foot/ankle mobility, endurance    PT Home Exercise Plan [email or web address]    Consulted and Agree with Plan of Care Patient             Patient will benefit from skilled therapeutic intervention in order to improve the following deficits and impairments:  Pain, Decreased activity tolerance, Difficulty walking, Decreased endurance, Hypomobility, Impaired flexibility  Visit Diagnosis: Pain in left ankle and joints of left foot - Plan: PT plan of care cert/re-cert  Other symptoms and signs involving the musculoskeletal system - Plan: PT plan of care cert/re-cert     Problem List Patient Active Problem List   Diagnosis Date Noted   Left insertional Achilles tendinosis 08/23/2021   Left ankle pain 05/23/2017   Lumbar degenerative disc disease 03/29/2015    Samone Guhl, PT 09/14/2021, 4:10 PM  [facility] [address] [place], [place], [address] Phone: [phone]   Fax:  [phone]  Name: Christopher [name] MRN: [id number] Date of Birth: Oct 05, 1980

## 2021-09-14 NOTE — Patient Instructions (Signed)
Access Code: [email or web address] URL: [email or web address] Date: 09/14/2021 Prepared by: Reggy Eye  Exercises - Gastroc Stretch on Wall  - 1 x daily - 7 x weekly - 1 sets - 3 reps - 20-30 secs hold - Soleus Stretch on Wall  - 1 x daily - 7 x weekly - 1 sets - 3 reps - 20-30 secs hold - Toe Yoga - Alternating Great Toe and Lesser Toe Extension  - 1 x daily - 7 x weekly - 2 sets - 10 reps - Seated Self Great Toe Stretch  - 1 x daily - 7 x weekly - 1 sets - 3 reps - 20-30 seconds hold

## 2021-09-26 ENCOUNTER — Encounter: Payer: Self-pay | Admitting: Rehabilitative and Restorative Service Providers"

## 2021-09-26 ENCOUNTER — Ambulatory Visit: Payer: No Typology Code available for payment source | Admitting: Rehabilitative and Restorative Service Providers"

## 2021-09-26 DIAGNOSIS — M25572 Pain in left ankle and joints of left foot: Secondary | ICD-10-CM

## 2021-09-26 DIAGNOSIS — R29898 Other symptoms and signs involving the musculoskeletal system: Secondary | ICD-10-CM
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
## 2021-09-26 NOTE — Patient Instructions (Signed)
Access Code: [email or web address] URL: [email or web address] Date: 09/26/2021 Prepared by: Corlis Leak  Exercises - Gastroc Stretch on Wall  - 1 x daily - 7 x weekly - 1 sets - 3 reps - 20-30 secs hold - Soleus Stretch on Wall  - 1 x daily - 7 x weekly - 1 sets - 3 reps - 20-30 secs hold - Toe Yoga - Alternating Great Toe and Lesser Toe Extension  - 1 x daily - 7 x weekly - 2 sets - 10 reps - Seated Self Great Toe Stretch  - 1 x daily - 7 x weekly - 1 sets - 3 reps - 20-30 seconds hold - Standing Heel Raise with Support  - 1 x daily - 7 x weekly - 1 sets - 10 reps - 1 sec  hold - Single Leg Stance  - 1 x daily - 7 x weekly - 2 sets - 5 reps - 20 sec hold - The Diver  - 1 x daily - 7 x weekly - 1 sets - 10 reps - 2-3 sec  hold - Seated Toe Towel Scrunches  - 1 x daily - 7 x weekly - Seated Marble Pick-Up with Toes  - 1 x daily - 7 x weekly - 1 sets - 3 reps - 1 min  hold - Prone Press Up  - 2 x daily - 7 x weekly - 1 sets - 10 reps - 2-3 sec  hold - Anti-Rotation Lateral Stepping with Press  - 1 x daily - 7 x weekly - 1-2 sets - 10 reps - 2-3 sec  hold

## 2021-10-03 ENCOUNTER — Ambulatory Visit: Payer: No Typology Code available for payment source | Admitting: Rehabilitative and Restorative Service Providers"

## 2021-10-04 ENCOUNTER — Ambulatory Visit (INDEPENDENT_AMBULATORY_CARE_PROVIDER_SITE_OTHER): Payer: No Typology Code available for payment source | Admitting: Sports Medicine

## 2021-10-04 DIAGNOSIS — M7662 Achilles tendinitis, left leg: Secondary | ICD-10-CM

## 2021-10-04 NOTE — Progress Notes (Signed)
    Procedures performed today:    None.  Independent interpretation of notes and tests performed by another provider:   None.  Brief History, Exam, Impression, and Recommendations:    Left insertional Achilles tendinosis Pleasant 41 year old male, continued left insertional Achilles pain, he has been doing one half of a topical nitroglycerin  patch, eccentric conditioning with physical therapy, unfortunately continues to have discomfort and is only about 30 to 40% better from the previous visit. He is not yet ready to consider injection, I will have him do more of the Mediterranean diet, continue eccentric heel drops, continue heel lift pads in his shoes. Return to see us  in 4 to 6 weeks, and we can do the injection, his in-law does have a cam boot and he agrees to bring it for if and when we need to do a retrocalcaneal bursa injection.    ____________________________________________ Debby PARAS. Curtis, M.D., ABFM., CAQSM., AME. Primary Care and Sports Medicine [facility]  Adjunct Professor of [facility] Medicine  [employer]  Restaurant Manager, [employer]

## 2021-10-04 NOTE — Assessment & Plan Note (Signed)
Pleasant 41 year old male, continued left insertional Achilles pain, he has been doing one half of a topical nitroglycerin patch, eccentric conditioning with physical therapy, unfortunately continues to have discomfort and is only about 30 to 40% better from the previous visit. He is not yet ready to consider injection, I will have him do more of the Mediterranean diet, continue eccentric heel drops, continue heel lift pads in his shoes. Return to see [place] in 4 to 6 weeks, and we can do the injection, his in-law does have a cam boot and he agrees to bring it for if and when we need to do a retrocalcaneal bursa injection.

## 2021-10-08 ENCOUNTER — Encounter: Payer: Self-pay | Admitting: Sports Medicine

## 2021-10-17 ENCOUNTER — Ambulatory Visit: Payer: BC Managed Care – PPO | Admitting: Medical-Surgical

## 2021-11-01 ENCOUNTER — Ambulatory Visit: Payer: No Typology Code available for payment source | Admitting: Sports Medicine

## 2021-12-13 ENCOUNTER — Inpatient Hospital Stay: Payer: No Typology Code available for payment source | Admitting: Family Medicine

## 2022-07-27 IMAGING — CT CT HEAD W/O CM
4 series · 17 of 47 positions shown, 19 images · non-contrast
Comparison: None

CLINICAL DATA: Head trauma, fell 2 days ago striking forehead on
corner of office desk, slight headache, dizziness, some memory
problems and feeling of disorientation

EXAM:
CT HEAD WITHOUT CONTRAST
TECHNIQUE: Contiguous axial images were obtained from the base of the skull
through the vertex without intravenous contrast. Sagittal and
coronal MPR images reconstructed from axial data set.

[Series 2: head wo · axial · 0.43mm/px · z∈[+103,+223]mm · 7 of 32 slices shown, 9 images]
[im 4/32  brain]
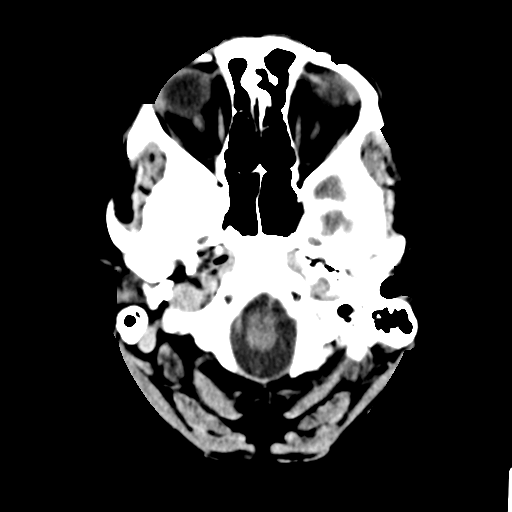
[im 4/32  bone]
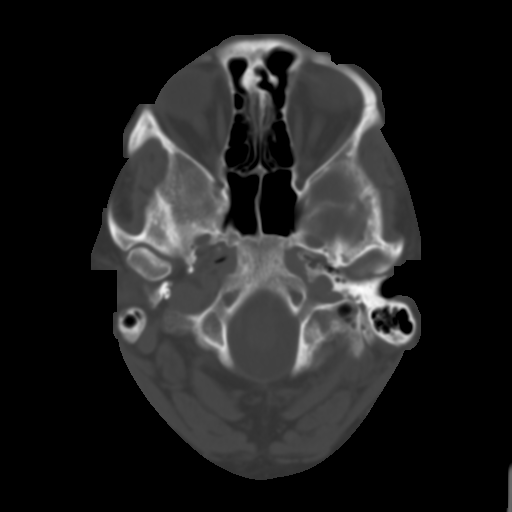
[im 8/32  brain]
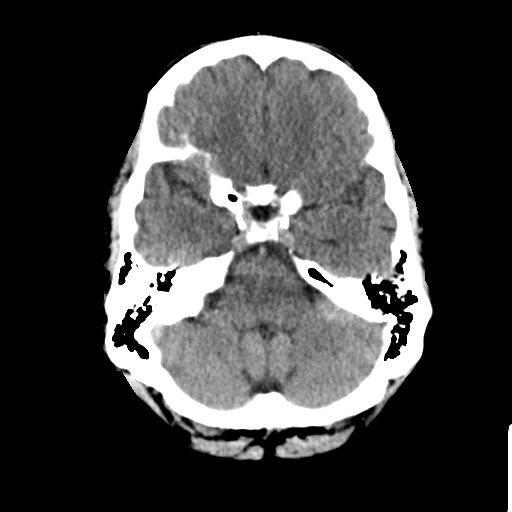
[im 12/32  brain]
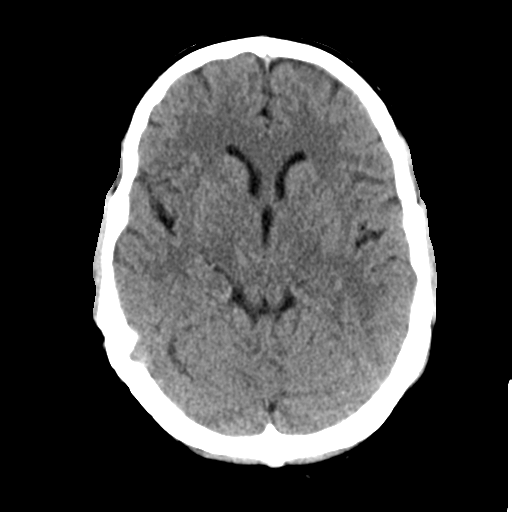
[im 16/32  brain]
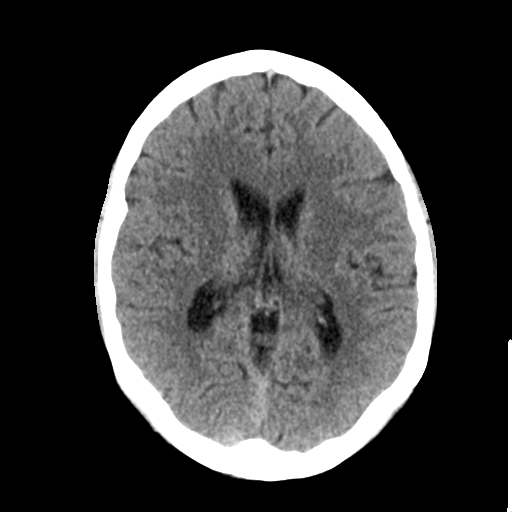
[im 20/32  brain]
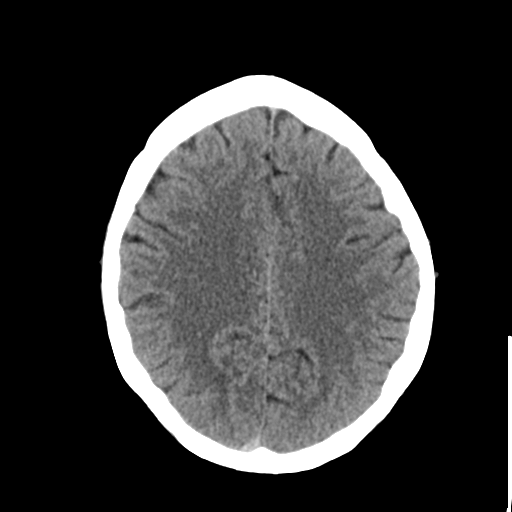
[im 20/32  bone]
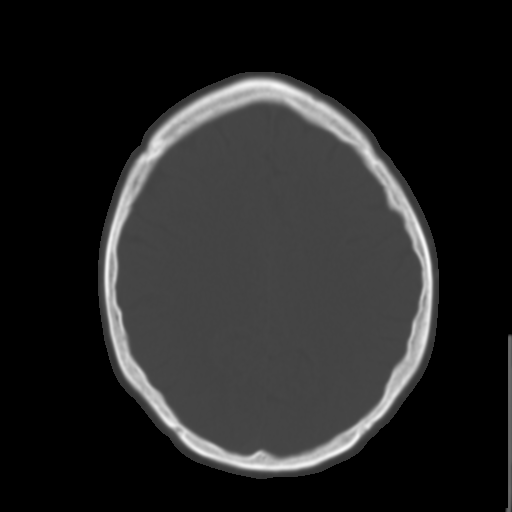
[im 24/32  brain]
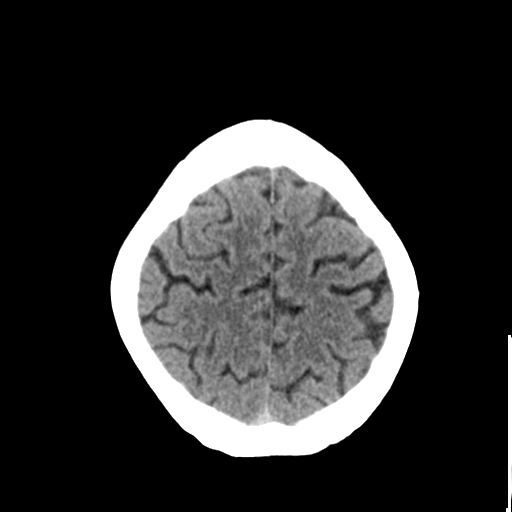
[im 28/32  brain]
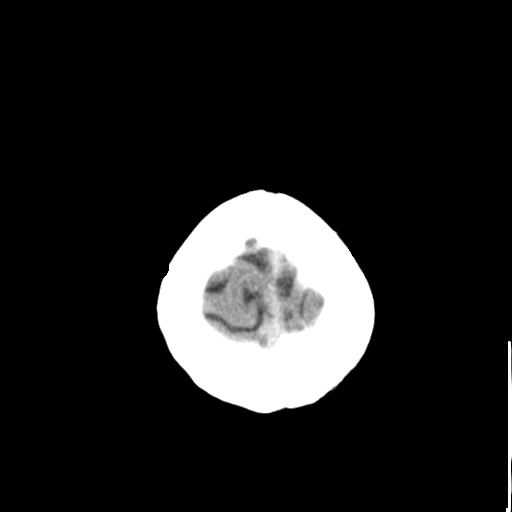

[Series 3: head bone 2x2 · axial · 0.43mm/px · z∈[+102,+158]mm · 4 of 80 slices shown]
[im 8/80  bone]
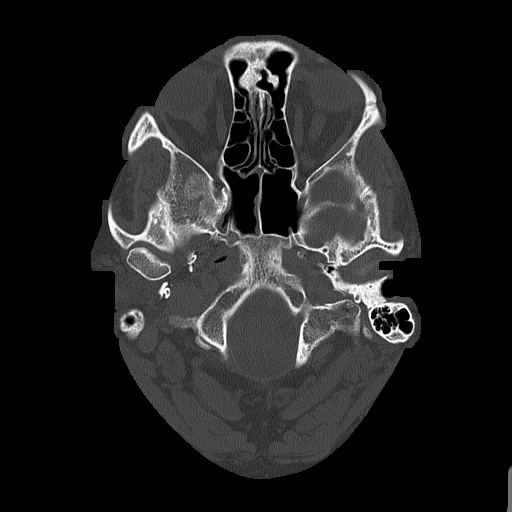
[im 16/80  bone]
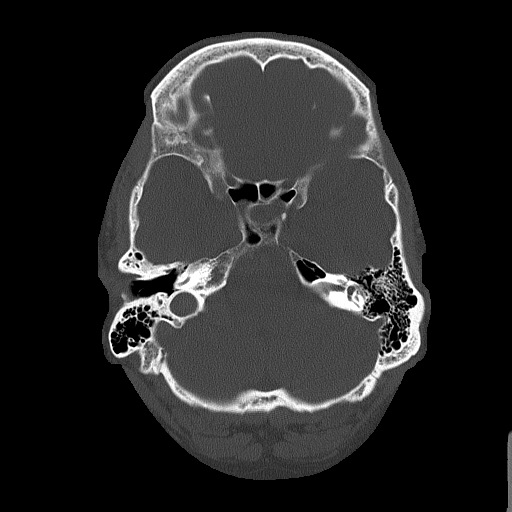
[im 24/80  bone]
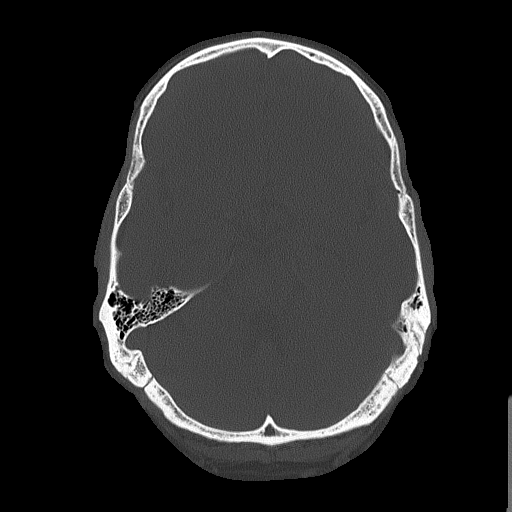
[im 36/80  bone]
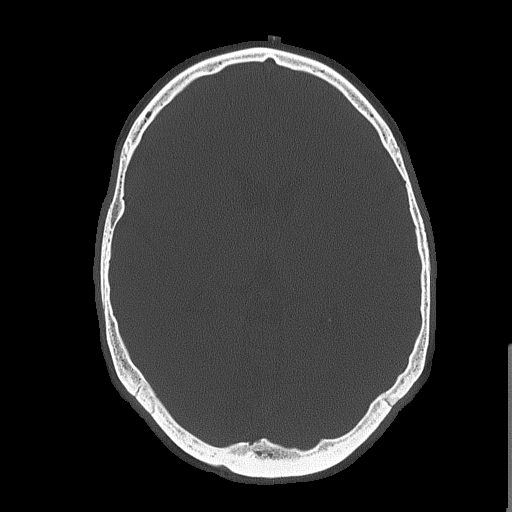

[Series 4: head wo sagittal · sagittal · 0.35mm/px · 3 of 58 slices shown]
[im 20/58  brain]
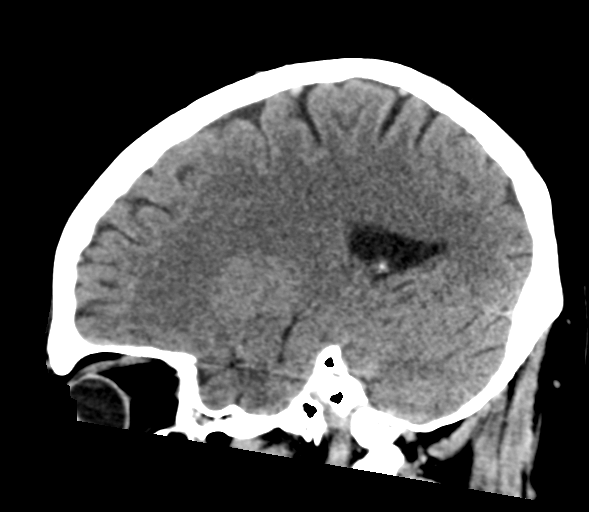
[im 29/58  brain]
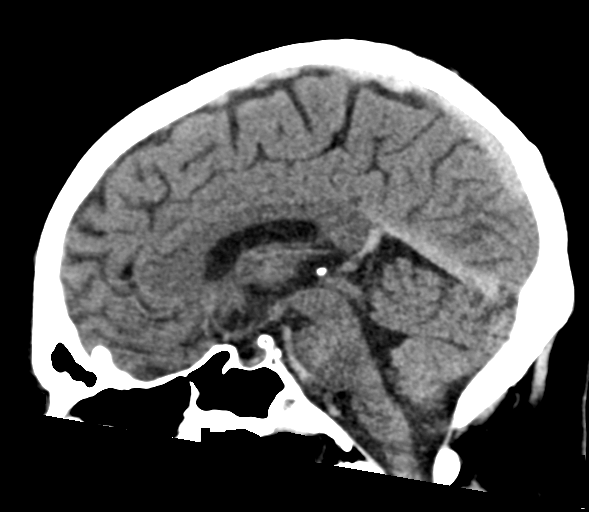
[im 39/58  brain]
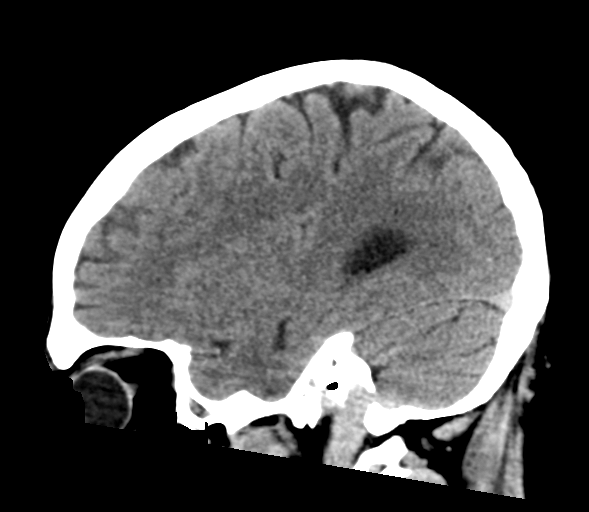

[Series 5: head wo coronal · coronal · 0.33mm/px · 3 of 69 slices shown]
[im 23/69  brain]
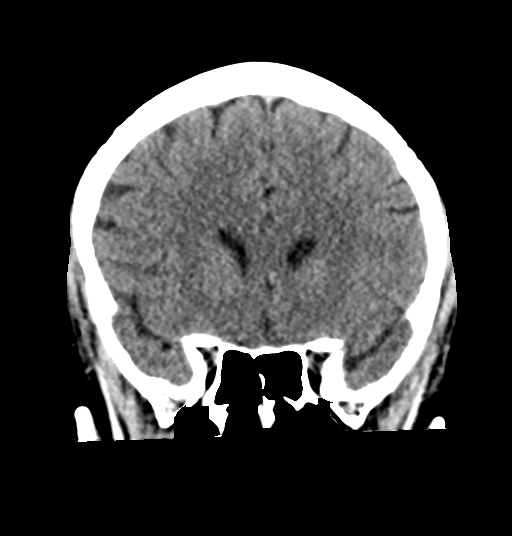
[im 31/69  brain]
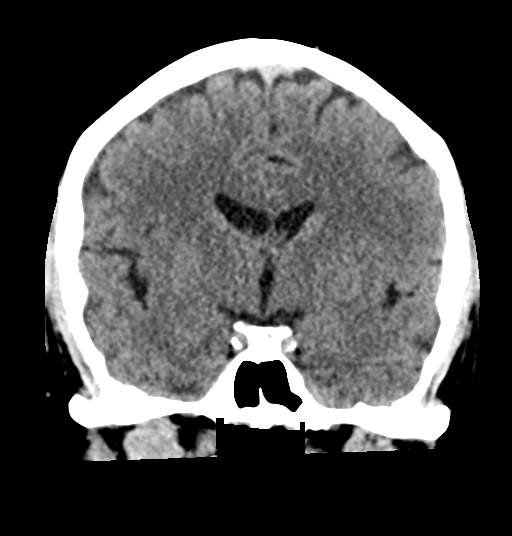
[im 38/69  brain]
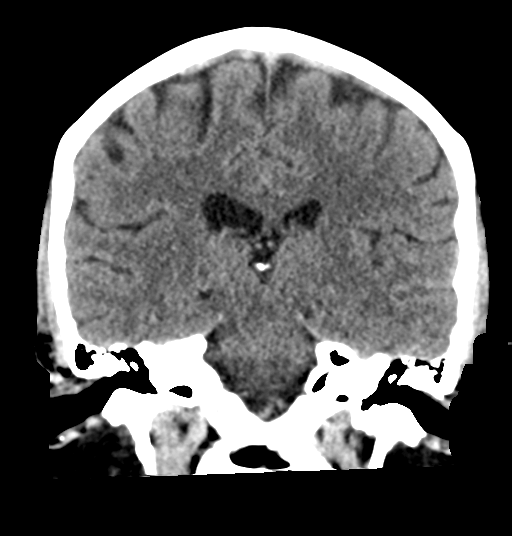

[17 of 47 positions shown; findings below may reference images not displayed]

FINDINGS: Brain: Slight asymmetry of the lateral ventricles, within acceptable
limits. No midline shift or mass effect. Normal appearance of brain
parenchyma. No intracranial hemorrhage, mass lesion, evidence of
acute infarction, or extra-axial fluid collection.

Vascular: No hyperdense vessels

Skull: Intact

Sinuses/Orbits: Clear

Other: N/A
IMPRESSION: Normal exam.

## 2023-04-26 ENCOUNTER — Ambulatory Visit (INDEPENDENT_AMBULATORY_CARE_PROVIDER_SITE_OTHER): Payer: No Typology Code available for payment source | Admitting: Sports Medicine

## 2023-04-26 ENCOUNTER — Encounter: Payer: Self-pay | Admitting: Neurology

## 2023-04-26 ENCOUNTER — Telehealth: Payer: Self-pay

## 2023-04-26 DIAGNOSIS — R569 Unspecified convulsions: Secondary | ICD-10-CM | POA: Diagnosis not present

## 2023-04-26 DIAGNOSIS — F10930 Alcohol use, unspecified with withdrawal, uncomplicated: Secondary | ICD-10-CM

## 2023-04-26 DIAGNOSIS — F10939 Alcohol use, unspecified with withdrawal, unspecified: Secondary | ICD-10-CM | POA: Insufficient documentation

## 2023-04-26 NOTE — Assessment & Plan Note (Signed)
Christopher [name] is a 43 year old male, I have seen him in the past as a sports medicine provider, we treated Achilles tendinosis which has since resolved, he made an appointment on my schedule because the electronic medical record allows anyone to make an appointment for anything, he was hoping to get treatment for his alcoholism and withdrawal seizures. I am not his primary care provider and he does not have one. I explained him that as a sports medicine provider would not be the one to manage his alcoholism, withdrawal seizures. I explained to him that he would need to find a primary care provider and that considering his history of alcohol withdrawal seizure, ER visit, signing out AGAINST MEDICAL ADVICE, he would need a substance abuse. He needs to find a primary care provider, and I will go and do referrals to neurology and intensive outpatient for psychiatry

## 2023-04-26 NOTE — Telephone Encounter (Signed)
Copied from CRM [id number]. Topic: General - Other >> Apr 26, 2023  4:30 PM Joanette Gula wrote: Patient states that he was just there a few minutes ago and forgot to get a doctors note for school/work.. His phone number is [phone]

## 2023-04-26 NOTE — Progress Notes (Signed)
    Procedures performed today:    None.  Independent interpretation of notes and tests performed by another provider:   I spent a great deal of time reviewing the patient's hospitalization records, labs, emergency department visits.  Brief History, Exam, Impression, and Recommendations:    Alcohol withdrawal seizure (HCC) Christopher [name] is a 43 year old male, I have seen him in the past as a sports medicine provider, we treated Achilles tendinosis which has since resolved, he made an appointment on my schedule because the electronic medical record allows anyone to make an appointment for anything, he was hoping to get treatment for his alcoholism and withdrawal seizures. I am not his primary care provider and he does not have one. I explained him that as a sports medicine provider would not be the one to manage his alcoholism, withdrawal seizures. I explained to him that he would need to find a primary care provider and that considering his history of alcohol withdrawal seizure, ER visit, signing out AGAINST MEDICAL ADVICE, he would need a substance abuse. He needs to find a primary care provider, and I will go and do referrals to neurology and intensive outpatient for psychiatry  I spent 40 minutes of total time managing this patient today, this includes chart review, face to face, and non-face to face time.  We did consider admission today, patient contracts for safety.   ____________________________________________ Ihor Austin. Benjamin Stain, M.D., ABFM., CAQSM., AME. Primary Care and Sports Medicine [facility]  Adjunct Professor of Family Medicine  [employer]  Restaurant manager, fast food

## 2023-04-27 NOTE — Telephone Encounter (Signed)
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  If he wants a letter go ahead and write one for him, Dawn.  He was inappropriately placed on my schedule for alcohol withdrawal seizures.

## 2023-04-30 ENCOUNTER — Encounter: Payer: Self-pay | Admitting: Sports Medicine

## 2023-05-02 ENCOUNTER — Encounter: Payer: No Typology Code available for payment source | Admitting: Family Medicine

## 2023-05-15 NOTE — Telephone Encounter (Signed)
Attempted a second call to the patient to reschedule for [facility]. No answer. Unable to leave a message, the voicemail is full.

## 2023-05-28 ENCOUNTER — Ambulatory Visit: Payer: No Typology Code available for payment source | Admitting: Family Medicine

## 2023-06-05 ENCOUNTER — Ambulatory Visit: Payer: No Typology Code available for payment source | Admitting: Neurology

## 2023-12-11 ENCOUNTER — Encounter: Payer: Self-pay | Admitting: Sports Medicine
